# Patient Record
Sex: Female | Born: 1970 | Race: White | Hispanic: No | Marital: Married | Smoking: Never smoker
Health system: Southern US, Community
[De-identification: ages and names within clinical notes are randomized; demographics above are authoritative.]

## PROBLEM LIST (undated history)

## (undated) DIAGNOSIS — R51 Headache: Secondary | ICD-10-CM

## (undated) DIAGNOSIS — J45901 Unspecified asthma with (acute) exacerbation: Secondary | ICD-10-CM

## (undated) DIAGNOSIS — F32A Depression, unspecified: Secondary | ICD-10-CM

## (undated) DIAGNOSIS — R0902 Hypoxemia: Secondary | ICD-10-CM

## (undated) DIAGNOSIS — F329 Major depressive disorder, single episode, unspecified: Secondary | ICD-10-CM

## (undated) DIAGNOSIS — R519 Headache, unspecified: Secondary | ICD-10-CM

## (undated) HISTORY — PX: ABDOMINAL HYSTERECTOMY: SHX81

## (undated) HISTORY — DX: Unspecified asthma with (acute) exacerbation: J45.901

## (undated) HISTORY — PX: KNEE SURGERY: SHX244

## (undated) HISTORY — PX: TONSILLECTOMY: SUR1361

---

## 1996-11-14 DIAGNOSIS — O1490 Unspecified pre-eclampsia, unspecified trimester: Secondary | ICD-10-CM

## 1997-07-18 ENCOUNTER — Encounter: Admission: RE | Admit: 1997-07-18 | Discharge: 1997-09-13 | Payer: Self-pay | Admitting: Obstetrics & Gynecology

## 1997-07-26 ENCOUNTER — Inpatient Hospital Stay (HOSPITAL_COMMUNITY): Admission: AD | Admit: 1997-07-26 | Discharge: 1997-07-26 | Payer: Self-pay | Admitting: Obstetrics and Gynecology

## 1997-10-15 ENCOUNTER — Inpatient Hospital Stay (HOSPITAL_COMMUNITY): Admission: AD | Admit: 1997-10-15 | Discharge: 1997-10-15 | Payer: Self-pay | Admitting: Obstetrics and Gynecology

## 1997-11-04 ENCOUNTER — Inpatient Hospital Stay (HOSPITAL_COMMUNITY): Admission: AD | Admit: 1997-11-04 | Discharge: 1997-11-06 | Payer: Self-pay | Admitting: Obstetrics and Gynecology

## 1997-12-12 ENCOUNTER — Other Ambulatory Visit: Admission: RE | Admit: 1997-12-12 | Discharge: 1997-12-12 | Payer: Self-pay | Admitting: Obstetrics and Gynecology

## 1999-05-30 ENCOUNTER — Other Ambulatory Visit: Admission: RE | Admit: 1999-05-30 | Discharge: 1999-05-30 | Payer: Self-pay | Admitting: Obstetrics and Gynecology

## 2000-06-04 ENCOUNTER — Other Ambulatory Visit: Admission: RE | Admit: 2000-06-04 | Discharge: 2000-06-04 | Payer: Self-pay | Admitting: Obstetrics and Gynecology

## 2000-06-11 ENCOUNTER — Encounter: Payer: Self-pay | Admitting: Obstetrics and Gynecology

## 2000-06-11 ENCOUNTER — Encounter: Admission: RE | Admit: 2000-06-11 | Discharge: 2000-06-11 | Payer: Self-pay | Admitting: Obstetrics and Gynecology

## 2002-04-13 ENCOUNTER — Other Ambulatory Visit: Admission: RE | Admit: 2002-04-13 | Discharge: 2002-04-13 | Payer: Self-pay | Admitting: Obstetrics and Gynecology

## 2002-10-20 ENCOUNTER — Ambulatory Visit (HOSPITAL_COMMUNITY): Admission: RE | Admit: 2002-10-20 | Discharge: 2002-10-20 | Payer: Self-pay | Admitting: Obstetrics and Gynecology

## 2004-01-11 ENCOUNTER — Other Ambulatory Visit: Admission: RE | Admit: 2004-01-11 | Discharge: 2004-01-11 | Payer: Self-pay | Admitting: Obstetrics and Gynecology

## 2004-05-03 ENCOUNTER — Ambulatory Visit (HOSPITAL_COMMUNITY): Admission: RE | Admit: 2004-05-03 | Discharge: 2004-05-03 | Payer: Self-pay | Admitting: Obstetrics and Gynecology

## 2004-05-12 ENCOUNTER — Emergency Department (HOSPITAL_COMMUNITY): Admission: EM | Admit: 2004-05-12 | Discharge: 2004-05-12 | Payer: Self-pay | Admitting: Ophthalmology

## 2004-05-18 ENCOUNTER — Ambulatory Visit (HOSPITAL_COMMUNITY): Admission: RE | Admit: 2004-05-18 | Discharge: 2004-05-18 | Payer: Self-pay | Admitting: Obstetrics and Gynecology

## 2004-07-27 ENCOUNTER — Inpatient Hospital Stay (HOSPITAL_COMMUNITY): Admission: AD | Admit: 2004-07-27 | Discharge: 2004-07-27 | Payer: Self-pay | Admitting: Obstetrics and Gynecology

## 2004-08-20 ENCOUNTER — Encounter (INDEPENDENT_AMBULATORY_CARE_PROVIDER_SITE_OTHER): Payer: Self-pay | Admitting: Specialist

## 2004-08-21 ENCOUNTER — Inpatient Hospital Stay (HOSPITAL_COMMUNITY): Admission: AD | Admit: 2004-08-21 | Discharge: 2004-08-22 | Payer: Self-pay | Admitting: Obstetrics and Gynecology

## 2008-08-22 ENCOUNTER — Encounter: Admission: RE | Admit: 2008-08-22 | Discharge: 2008-08-22 | Payer: Self-pay | Admitting: Obstetrics and Gynecology

## 2008-11-08 ENCOUNTER — Emergency Department (HOSPITAL_COMMUNITY): Admission: EM | Admit: 2008-11-08 | Discharge: 2008-11-09 | Payer: Self-pay | Admitting: Emergency Medicine

## 2010-09-25 LAB — COMPREHENSIVE METABOLIC PANEL
ALT: 11 U/L (ref 0–35)
Alkaline Phosphatase: 67 U/L (ref 39–117)
CO2: 28 mEq/L (ref 19–32)
Chloride: 106 mEq/L (ref 96–112)
Creatinine, Ser: 0.68 mg/dL (ref 0.4–1.2)
GFR calc Af Amer: >60 mL/min (ref >60)
Potassium: 4.1 mEq/L (ref 3.5–5.1)
Total Bilirubin: 0.6 mg/dL (ref 0.3–1.2)

## 2010-09-25 LAB — CBC
Hemoglobin: 13.9 g/dL (ref 12.0–15.0)
MCHC: 33.7 g/dL (ref 30.0–36.0)
RBC: 4.53 MIL/uL (ref 3.87–5.11)
RDW: 13 % (ref 11.5–15.5)

## 2010-09-25 LAB — WET PREP, GENITAL
Clue Cells Wet Prep HPF POC: NONE SEEN
Trich, Wet Prep: NONE SEEN
Yeast Wet Prep HPF POC: NONE SEEN

## 2010-09-25 LAB — DIFFERENTIAL
Eosinophils Absolute: 0.1 10*3/uL (ref 0.0–0.7)
Lymphocytes Relative: 39 % (ref 12–46)
Lymphs Abs: 4.2 10*3/uL — ABNORMAL HIGH (ref 0.7–4.0)
Neutro Abs: 5.6 10*3/uL (ref 1.7–7.7)
Neutrophils Relative %: 53 % (ref 43–77)

## 2010-09-25 LAB — URINALYSIS, ROUTINE W REFLEX MICROSCOPIC
Hgb urine dipstick: NEGATIVE
Ketones, ur: NEGATIVE mg/dL
Nitrite: NEGATIVE
Specific Gravity, Urine: 1.024 (ref 1.005–1.030)
Urobilinogen, UA: 0.2 mg/dL (ref 0.0–1.0)
pH: 6 (ref 5.0–8.0)

## 2010-09-25 LAB — LIPASE, BLOOD: Lipase: 25 U/L (ref 11–59)

## 2010-09-25 LAB — POCT PREGNANCY, URINE: Preg Test, Ur: NEGATIVE

## 2010-11-02 NOTE — Discharge Summary (Signed)
Melissa Dodson, Melissa Dodson          ACCOUNT NO.:  [id number]   MEDICAL RECORD NO.:  [id number]          PATIENT TYPE:  INP   LOCATION:  9315                          FACILITY:  [facility]   PHYSICIAN:  Dineen Kid. Rana Snare, M.D.    DATE OF BIRTH:  Jun 07, 1971   DATE OF ADMISSION:  08/20/2004  DATE OF DISCHARGE:                                 DISCHARGE SUMMARY   HISTORY OF PRESENT ILLNESS:  Melissa Dodson is a 40 year old white female G2  P2, worsening pelvic pain over the last 6-9 months. She underwent  laparoscopy in December. There were adhesions in the left adnexa. The pain  improved for 3-4 weeks after the surgery, subsequently has gotten worse  requiring narcotics and antiinflammatories and incapacitating Victorino Dike. She  underwent a CAT scan, ultrasound, and full evaluation with no obvious source  of the pain. She desires definitive surgical intervention and plans left  salpingo-oophorectomy with hysterectomy. She declined Lupron Depot therapy  and is currently on Depo-Provera with good suppression of menses.   HOSPITAL COURSE:  The patient was admitted to same-day surgery and underwent  an LVH with LSO. The findings at the time of surgery were adhesions from the  colon and pelvic sidewall to the left tuboovarian complex. Surgery was  uncomplicated with estimated blood loss of 300 mL.  The patient's  postoperative course was complicated by postoperative pain management  issues. The patient had developed a tolerance to oxycodone before the  surgery and required a lot of narcotic-type of medication during surgery.  Postoperative pain finally was controlled by postoperative day #2 with PCA  morphine. Had a consultation with pain management from anesthesia. We began  her on regular dosing of oxycodone in the 10-20 mg q.6h. regimen with  Percocet as needed for breakthrough pain. By postoperative day #2 she was  tolerating a regular diet, ambulated without difficulty. Abdomen was soft,  nontender,  nondistended. The incisions were clean, dry, and intact. She had  normal active bowel sounds and her postoperative hemoglobin was 12.3 and the  patient was discharged home.   DISPOSITION:  The patient will follow up in the office in 2 weeks. Sent home  with a prescription for Percocet 10/325 #100. She is to take these one to  two q.6h. as needed for pain. She is also going to go home on Motrin 800 mg  q.8h. and given a prescription for Ambien 10 mg to take at bedtime as  needed. I told her to return for increased pain, fever, or bleeding.      DCL/MEDQ  D:  08/22/2004  T:  08/22/2004  Job:  [id number]

## 2010-11-02 NOTE — Op Note (Signed)
Melissa, Dodson          ACCOUNT NO.:  [id number]   MEDICAL RECORD NO.:  [id number]          PATIENT TYPE:  AMB   LOCATION:  SDC                           FACILITY:  [facility]   PHYSICIAN:  Dineen Kid. Rana Snare, M.D.    DATE OF BIRTH:  [birth year]   DATE OF PROCEDURE:  05/18/2004  DATE OF DISCHARGE:                                 OPERATIVE REPORT   PREOPERATIVE DIAGNOSES:  Left lower quadrant pain.   POSTOPERATIVE DIAGNOSES:  Left lower quadrant pain plus pelvic adhesions.   PROCEDURE:  Laparoscopy with lysis of adhesions.   SURGEON:  Dineen Kid. Rana Snare, M.D.   ANESTHESIA:  General endotracheal.   INDICATIONS FOR PROCEDURE:  [name] is a 40 year old with worsening  left lower quadrant. She has been on Depot Provera for the last number of  years with amenorrhea.  Underwent an ultrasound showing a normal GYN  ultrasound with small functional cyst not correlating with the amount of  pelvic pain and also the right ovarian cyst was noted on the right ovary and  not the left.  Her pain was persistent and worsening now requiring  antiinflammatories and Percocet without fever or chills. She underwent a CAT  Scan with and without contrast that showed normal bowel, ureters and pelvis.  The patient presented to the emergency room after that with worsening pain  that has not improved. They told her to followup with me for a laparoscopic  evaluation of the pelvis. She presents for that today. She has been missing  activities due to the pain and more or less incapacitated using pain  medication just to handle the pain and thereby not able to perform socially.  The risks and benefits were discussed at length which include but not  limited to risk of infection, bleeding, damage to bowel, bladder, ureters,  uterus, tubes, ovaries, possibly not be able to alleviate the pelvic pain or  recurrence of the pelvic pain. She does give her informed consent and wish  to proceed.   FINDINGS:  Bowel and  omental adhesions to the left broad ligament and left  pelvic sidewall, normal appearing ovaries, cul-de-sac, uterus, normal  appearing appendix and liver.   DESCRIPTION OF PROCEDURE:  After adequate analgesia, the patient placed in  the dorsal lithotomy position, she was sterilely prepped and draped, the  bladder was sterilely drained. A Graves speculum was placed and tenaculum  was placed on the anterior lip of the cervix. Once the infraumbilical skin  incision was made, a Veress needle was inserted, the abdomen was insufflated  with dullness to percussion.  A 5 mm trocar was inserted to the left of  midline under direct visualization.  The above findings were noted. Bipolar  cautery and endoshear scissors were used to coagulate and dissect the bowel  and omentum off the broad ligament freeing it up off the broad ligament and  rough fallopian tube, care taken to avoid injury to the underlying vessels,  fallopian tube and bowel. The omentum and bowel were dissected from the  pelvic sidewall and left lower abdominal wall in a similar fashion without  complications. After  good hemostasis was noted to be achieved and normal  anatomy restored, Intercede adhesion barrier was placed along the left lower  quadrant sidewall as well as placed over the broad ligament and around the  left fallopian tube with good placement noted. A small amount of saline was  placed on the Intercede __________ to ensure its placement.  The abdomen was  then desufflated, trocars removed, the infraumbilical skin incision was  closed with a suture of #0 Vicryl in the fascia and a 3-0 Vicryl Rapide  subcuticular suture. The 5 mm side was closed with a 3-0 Vicryl Rapide  subcuticular suture and a 3-0 Vicryl Rapide interrupted suture with good  approximation and good hemostasis. The incisions were then injected with  0.25% Marcaine, 10 mL total used. The tenaculum removed from the cervix and  noted to be hemostatic.  The  patient tolerated the procedure well and was  stable on transfer to the recovery room.  Sponge and instrument count was  normal x3.  Estimated blood loss during the procedure was 25 mL.  The  patient received 1 g of Rocephin preoperatively and 30 mg of Toradol  postoperatively.   DISPOSITION:  The patient will be discharged home and will followup in the  office in 2-3 weeks. She was sent home with a routine instruction sheet for  laparoscopy, sent home also with a prescription for MiraLax because of some  distention of the bowel consistent with constipation and she has a  prescription for Tylox at home. She is to call for increased pain, fever or  bleeding.      DCL/MEDQ  D:  05/18/2004  T:  05/18/2004  Job:  [id number]

## 2010-11-02 NOTE — Op Note (Signed)
NAME:  Melissa Dodson, Melissa Dodson                    ACCOUNT NO.:  [id number]   MEDICAL RECORD NO.:  [id number]                   PATIENT TYPE:  AMB   LOCATION:  SDC                                  FACILITY:  [facility]   PHYSICIAN:  Dineen Kid. Rana Snare, M.D.                 DATE OF BIRTH:  January 22, 1971   DATE OF PROCEDURE:  10/20/2002  DATE OF DISCHARGE:                                 OPERATIVE REPORT   PREOPERATIVE DIAGNOSES:  Symptomatic pelvic relaxation, decreased tone of  the introitus.   POSTOPERATIVE DIAGNOSES:  Symptomatic pelvic relaxation, decreased tone of  the introitus.   PROCEDURE:  Posterior colporrhaphy with perineorrhaphy.   SURGEON:  Dineen Kid. Rana Snare, M.D.   ANESTHESIA:  General endotracheal.   INDICATIONS:  The patient is a 41 year old G2, P2 who both her and her  husband report decreased sensation with intercourse, feel like it is related  to childbirth, and want surgical tightening of the vaginal muscles to  decrease pressure with intercourse.  They tried Kegel maneuvers.  At this  time desires surgical repair.  Denies any urinary incontinence, pressure  symptoms other than small amount of gaping in the dorsal lithotomy position.  She does have a first degree rectocele and increased tone.  She presents for  posterior colporrhaphy with perineorrhaphy.  Risks and benefits were  discussed at length.  Informed consent was obtained.  See history and  physical for further details.   DESCRIPTION OF PROCEDURE:  After adequate analgesia the patient is placed in  a dorsal lithotomy position.  She is sterilely prepped and draped.  The  bladder was sterilely drained.  The left and right portion of the hymenal  ring were grasped with Allis clamps.  A triangular flap was made across the  perineal body with a scalpel and the skin is reflected off the perineal  body.  The posterior vaginal mucosa was entered by Metzenbaum scissors and  was incised in the midline ________ laterally to  approximately 4-5 cm into  the introitus above the hymenal ring.  After reflecting laterally care was  taken to dissect the perineal and posterior vaginal tissue from the  surrounding muscles.  At this point the posterior repair was carried out  plicating the rectal fascia in the midline using figure-of-eight of 0  Monocryl achieving hemostasis with the sutures and also with Bovie cautery.  Once to the perineal body, care was taken to plicate the bulbocavernosus  muscles and superficial transverse perinei muscles with figure-of-eights of  0  Monocryl suture with good tone and strength noted of the perineal body.  Excess posterior vaginal mucosa was then excised and posterior vaginal  mucosa was closed with a 3-0 Vicryl Rapide running suture.  It was tied at  the hymenal ring.  A second suture of 0 Monocryl was used to plicate the  superficial transverse perinei muscles.  The perineal skin was then closed  with a subcuticular  stitch of 3-0 Vicryl Rapide tying at the hymenal ring  and then second supporting suture of interrupted 3-0 Vicryl Rapides were  placed across the perineal body supporting the skin.  At this point  hemostasis was achieved.  Good muscle tone was palpated.  Able to insert two  fingers at this point with a very tight fit into the introitus where as  previously three to four fingers could easily be inserted.  Rectovaginal at  this point reveals good rectal tone and also good support of the rectocele  and perineal body.  At this point the legs were replaced back into normal  anatomic position and patient was sent to the recovery room in stable  condition.  Estimated blood loss from the procedure was less than 50 mL.  Sponge and instrument count was normal x3.    DISPOSITION:  The patient will be discharged home to follow up in the office  in two to three weeks.  Told to continue with ice for the first 24 hours.  She is sent home with a prescription for Darvocet number 30.   Told to return  for increased bleeding, pain, or fever.                                               Dineen Kid Rana Snare, M.D.    DCL/MEDQ  D:  10/20/2002  T:  10/20/2002  Job:  [id number]

## 2010-11-02 NOTE — Op Note (Signed)
Melissa Dodson, Melissa Dodson          ACCOUNT NO.:  [id number]   MEDICAL RECORD NO.:  [id number]          PATIENT TYPE:  OBV   LOCATION:  9399                          FACILITY:  [facility]   PHYSICIAN:  Dineen Kid. Rana Snare, M.D.    DATE OF BIRTH:  06-30-1970   DATE OF PROCEDURE:  08/20/2004  DATE OF DISCHARGE:                                 OPERATIVE REPORT   PREOPERATIVE DIAGNOSIS:  1.  Pelvic pain.  2.  History of pelvic adhesions.   POSTOPERATIVE DIAGNOSES:  1.  Pelvic pain.  2.  History of pelvic adhesions.   PROCEDURE:  Laparoscopic-assisted vaginal hysterectomy with left salpingo-  oophorectomy and lysis of adhesions.   SURGEON:  Dineen Kid. Rana Snare, M.D.   ASSISTANT:  Stann Mainland. Vincente Poli, M.D.   ANESTHESIA:  General endotracheal.   INDICATIONS:  Melissa Dodson is a 40 year old white female, G2, P2, with  worsening pelvic pain over the last 6 months, underwent laparoscopy 3 months  ago with some adhesions in the left adnexa which were removed.  She had  improvement for 3-4 weeks that has returned and has gotten worse to the  point of requiring narcotic-type of pain medication and nonnarcotic anti-  inflammatory medications.  She has amenorrhea due to Depo-Provera.  She  underwent CAT scan, pelvic ultrasound none of which has showed identifiable  causes.  She desires definitive surgical intervention, planned hysterectomy  with left salpingo-oophorectomy.  Other alternatives such was repeat  laparoscopy, removal of left tube and ovary by itself, or Depo-Lupron were  discussed.  The patient desires hysterectomy with left salpingo-  oophorectomy.  Risks and benefits were discussed at length which include but  are not limited to risk of infection, bleeding, damage to the bowel,  bladder, ureters, right ovary, possibility that this may not alleviate the  pain, could even worsen the pain.  There is also risk associated with  anesthesia and blood transfusion.  She gives her informed consent and  wishes  to proceed.   FINDINGS AT THE TIME OF SURGERY:  Adhesions from the descending colon to the  left pelvic sidewall and the left tubo-ovarian complex.  Normal-appearing  cul-de-sac, normal-appearing right tube and ovary, normal-appearing uterus  and bladder.   DESCRIPTION OF PROCEDURE:  After adequate analgesia, the patient was placed  in the dorsal lithotomy position.  She was sterilely prepped and draped.  The bladder was sterilely drained.  A Hulka tenaculum was placed in the  cervix.  A 1 cm infraumbilical skin incision was made.  A Veress needle was  inserted.  The abdomen was insufflated to dullness on percussion.  An 11 mm  trocar was inserted.  The above findings were noted.  A 5 mm trocar was  inserted to the left of midline two fingerbreadths above the pubic  symphysis.  The Gyrus tripolar was used to coagulate and resect the omentum  and descending colon from the left pelvic sidewall and left tubo-ovarian  complex.  The mesosalpinx was dissected and left infundibulopelvic ligament  was identified and cauterized and dissected across the infundibulopelvic  down across the round ligament with the tube and ovary  falling towards the  uterine corpus.  Care was taken to avoid the underlying ureter and at the  same time achieve good hemostasis and release the bowel adhesions from the  tubo-ovarian complex and the pelvic sidewall.  The right utero-ovarian  ligament was identified, ligated, and dissected with the right tube and  ovary falling to the pelvic sidewall.  The bladder was elevated and small  windows made at the uterovesical angle and the small bladder flap was  created.  The legs were repositioned, the abdomen desufflated, weighted  speculum placed in the vagina.  A posterior colpotomy was performed.  The  Jacob's tenaculum was placed in the cervix.  The cervix was circumscribed  with Bovie cautery.  A LigaSure instrument was to ligate across the  uterosacral ligaments  and the bladder pillars bilaterally.  The bladder was  dissected off the anterior cervix.  The anterior peritoneum was entered  sharply.  A Deaver retractor was placed under the bladder.  Successive bites  with the LigaSure coagulated along the uterine vasculature up to the  inferior portion of the broad ligament bilaterally with dissection with Mayo  scissors until the uterus was removed with the left tube and ovary intact.  The uterosacral ligaments were identified, suture ligated with figure-of-  eights of 0 Monocryl suture.  The posterior peritoneum was then closed in a  pursestring fashion with 0 Monocryl suture.  The vagina was then closed in a  vertical fashion using a figure-of-eights of 0 Monocryl suture with good  approximation and good hemostasis.  The Foley catheter was placed with  return of clear, yellow urine.  The legs were repositioned.  The abdomen was  re-insufflated.  Hemostasis was achieved using bipolar cautery along the  peritoneal edges.  The right utero-ovarian ligament and the left  infundibulopelvic ligaments appeared to be hemostatic.  After copious amount  of irrigation, adequate hemostasis was assured.  Intercede adhesive barrier  was placed along the left pelvic sidewall over the infundibulopelvic  ligament and the area where the pelvic adhesions had been removed.  Good  placement noted.  The abdomen was then desufflated.  Trocars were removed.  The infraumbilical skin incision was closed with a 0 Vicryl in the fascia  interrupted suture, 3-0 Vicryl Rapide subcuticular suture.  The 5 mm site  was closed with two sutures of 3-0 Vicryl Rapide, first being a subcuticular  suture, the second being a interrupted suture.  Incision was then injected  with 0.25% Marcaine, total of 10 mL used.  The patient was stable and  transferred to recovery room.  Sponge and instrument count was normal x3. Estimated blood loss was 300 mL.  The patient received 1 g of Rocephin   preoperatively.      DCL/MEDQ  D:  08/20/2004  T:  08/20/2004  Job:  [id number]

## 2010-11-02 NOTE — H&P (Signed)
NAMEAMBERLYNN, TEMPESTA          ACCOUNT NO.:  [id number]   MEDICAL RECORD NO.:  [id number]           PATIENT TYPE:   LOCATION:                                 FACILITY:   PHYSICIAN:  Dineen Kid. Rana Snare, M.D.    DATE OF BIRTH:  [birth year]   DATE OF ADMISSION:  08/20/2004  DATE OF DISCHARGE:                                HISTORY & PHYSICAL   HISTORY OF PRESENT ILLNESS:  Melissa Dodson is a 40 year old white female G2, P2  with worsening pelvic pain over the last number of months.  She underwent a  laparoscopy at the beginning of December where there were some adhesions in  the left adnexa.  The pain improved after that for three to four weeks.  Subsequently, it has gotten worse with a predilection towards the left side.  She describes daily ache and pain which has gotten originally better on  ibuprofen, then requiring narcotic-type of pain medication.  She has been on  Depo-Provera for a number of years and has had amenorrhea for a number of  years, currently is taking Tylox and ibuprofen on a regular basis, taking 8-  10 Tylox a day for the pain.  She has had a full evaluation including  ultrasound, CT scan, IVP and laboratory evaluation which shows no known  cause of the pelvic pain, no fever or chills, nausea and vomiting, no  diarrhea or constipation.  Because of continued pelvic pain, she desires  definitive surgical intervention and requests hysterectomy with left  salpingo-oophorectomy.  Other alternatives such as repeat laparoscopy,  removing the left tube and ovary by itself or Depo-Lupron were discussed  with the patient and at this time, she presents for hysterectomy.   PAST MEDICAL HISTORY:  Roanne's past medical history is significant a  negative past medical history.  She had laparoscopy for lysis of adhesions.   PAST OBSTETRIC HISTORY:  She has had two vaginal deliveries.   MEDICATIONS:  1.  Currently on Depo-Provera.  2.  Ibuprofen.  3.  Tylox.   The patient is  allergic to sulfa.   PHYSICAL EXAMINATION:  VITAL SIGNS:  Blood pressure is 122/80.  HEART:  Regular rate and rhythm.  LUNGS:  Clear to auscultation bilaterally.  ABDOMEN:  Nondistended, nontender.  No rebound.  No guarding.  No  organomegaly.  BACK:  Without CVA tenderness.  PELVIC:  The uterus is anteverted, mobile, minimally tender to deep  palpation.  No adnexal masses are palpable.   Ultrasound on July 12, 2004 shows normal-appearing ovaries, no fluid in  the cul-de-sac.  The uterus measures 7.8 x 4.1 x 5.9 cm in size.  Normal in  appearance.  A CT scan on July 27, 2004 is a negative CT scan of the  pelvis, normal-appearing appendix and normal-appearing ovaries.   IMPRESSION AND PLAN:  Worsening pelvic pain, not improved with conservative  management which has included hormonal therapy, previous laparoscopy, the  patient currently on narcotics as well as anti-inflammatory medications with  minimal relief.  She desires definitive surgical intervention.   PLAN:  Laparoscopic-assisted vaginal hysterectomy with left salpingo-  oophorectomy.  We will  try to preserve the right ovary.  Any other indicated  procedures at the time including lysis of adhesions or ablation of  endometriosis implants.  The risks and benefits were discussed at length  with the patient and her husband which included but not limited to risk of  infection, bleeding, damage to bladder, bowel, ureters, injury to the right  ovary, the possibility that this may not alleviate the pelvic pain, could  even worsen the pelvic pain.  There is also risk associated with blood  transfusion, risk of anesthesia.  She does give her informed consent and  wishes to proceed.      DCL/MEDQ  D:  08/19/2004  T:  08/19/2004  Job:  [id number]

## 2010-11-02 NOTE — H&P (Signed)
NAME:  Melissa Dodson, Melissa Dodson                    ACCOUNT NO.:  [id number]   MEDICAL RECORD NO.:  [id number]                   PATIENT TYPE:  AMB   LOCATION:  SDC                                  FACILITY:  [facility]   PHYSICIAN:  Dineen Kid. Rana Snare, M.D.                 DATE OF BIRTH:  10-Aug-1970   DATE OF ADMISSION:  10/20/2002  DATE OF DISCHARGE:                                HISTORY & PHYSICAL   HISTORY OF PRESENT ILLNESS:  The patient is a 40 year old G2, P2 who, both  her and her husband, reports decreased sensation with intercourse.  They  feel this is related to childbirth and want surgical tightening of her  vaginal muscles. They feel like this has decreased the pleasure with  intercourse and have tried Kegel maneuvers; and, again, want surgical repair  of the perineum.  She does not have any urinary incontinence, denies any  pressure symptoms and no difficulty with bowel movements.  She has been on  Depo-Provera for a number of years and has amenorrhea with this, and  declines estrogen replacement therapy.   PAST MEDICAL HISTORY:  The patient's past medical history is negative.   PAST SURGICAL HISTORY:  The patient's past surgical history is also  negative.   PAST OBSTETRICAL HISTORY:  The patient does have two children.   MEDICATIONS:  Currently on Depo-Provera.   ALLERGIES:  The patient is allergic to SULFA.   PHYSICAL EXAMINATION:  VITAL SIGNS:  The patient is 224 pounds.  Blood  pressure is 120/82.  HEART:  Regular rate and rhythm.  LUNGS:  Clear to auscultation bilaterally.  ABDOMEN:  Nondistended and nontender.  PELVIC EXAMINATION:  Normal external genitalia, Bartholin, Skene's and  urethra. There is a small amount of gaping in the dorsal lithotomy position.  She has minimal UV angle loss with Valsalva.  She does have a first degree  cystocele and a first degree rectocele.  Minimal descensus of the uterus.  The uterus is anteverted, mobile and nontender.  No adnexal  masses are  palpable.  Rectovaginal confirms the above.  She has approximately a 1.5 cm  perineal body with good tone with a Kegel maneuver and also with good  voluntary contraction of the vaginal muscles.  Appears to have fairly good  muscular support both in the perineal body and around the bladder neck.   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   Discussed at length different treatment options including vaginal estrogen  due to the face she has been on Depo-Provera for a long period of time.  Also, I have recommended Kegel exercises as well as referral to [facility] for  physical therapy and exercise to strengthen the perineal body and  surrounding muscles.  After lengthy discussion regarding the different  treatment options the patient and her husband are convinced they want  surgical treatment.  She is willing to try  estrogen and exercises in the  meantime, but does want to proceed with surgery.  I did tell them that  perineorrhaphy is not guaranteed to alleviate the problem; and, some people  can even have more problems after that including pain with intercourse.  I  discussed the procedure, however, and the recovery time with the patient,  and the risks and benefits associated with the surgery, which include, but  are not limited risks of infection, bleeding, damage to bladder or bowel,  risks of worsening sensation or difficulty with bowel movements  or  continence after  the procedure.  I also discussed fistula formation, risks of bleeding and  also anesthesia; and, she does give her informed consent.  Again, the  patient and her husband are adamant about proceeding with some kind of  surgical repair.  We will plan a perineorrhaphy.                                                 Dineen Kid Rana Snare, M.D.    DCL/MEDQ  D:  10/19/2002  T:  10/19/2002  Job:  [id number]

## 2011-06-18 HISTORY — PX: LAPAROSCOPIC GASTRIC BANDING: SHX1100

## 2014-01-25 ENCOUNTER — Encounter (HOSPITAL_COMMUNITY): Payer: Self-pay | Admitting: Emergency Medicine

## 2014-01-25 ENCOUNTER — Emergency Department (HOSPITAL_COMMUNITY)
Admission: EM | Admit: 2014-01-25 | Discharge: 2014-01-25 | Disposition: A | Discharge status: Home / Self Care | Payer: Medicaid Other | Attending: Emergency Medicine | Admitting: Emergency Medicine

## 2014-01-25 ENCOUNTER — Emergency Department (HOSPITAL_COMMUNITY): Payer: Medicaid Other

## 2014-01-25 DIAGNOSIS — R109 Unspecified abdominal pain: Secondary | ICD-10-CM | POA: Diagnosis present

## 2014-01-25 DIAGNOSIS — Z79899 Other long term (current) drug therapy: Secondary | ICD-10-CM | POA: Insufficient documentation

## 2014-01-25 DIAGNOSIS — K8020 Calculus of gallbladder without cholecystitis without obstruction: Secondary | ICD-10-CM | POA: Diagnosis not present

## 2014-01-25 LAB — CBC WITH DIFFERENTIAL/PLATELET
BASOS PCT: 0 % (ref 0–1)
Basophils Absolute: 0 10*3/uL (ref 0.0–0.1)
EOS ABS: 0.1 10*3/uL (ref 0.0–0.7)
EOS PCT: 1 % (ref 0–5)
HEMATOCRIT: 41.3 % (ref 36.0–46.0)
Hemoglobin: 14.1 g/dL (ref 12.0–15.0)
Lymphocytes Relative: 25 % (ref 12–46)
Lymphs Abs: 2.1 10*3/uL (ref 0.7–4.0)
MCH: 29.7 pg (ref 26.0–34.0)
MCHC: 34.1 g/dL (ref 30.0–36.0)
MCV: 86.9 fL (ref 78.0–100.0)
MONOS PCT: 6 % (ref 3–12)
Monocytes Absolute: 0.5 10*3/uL (ref 0.1–1.0)
Neutro Abs: 5.7 10*3/uL (ref 1.7–7.7)
Neutrophils Relative %: 68 % (ref 43–77)
PLATELETS: 436 10*3/uL — AB (ref 150–400)
RBC: 4.75 MIL/uL (ref 3.87–5.11)
RDW: 12.6 % (ref 11.5–15.5)
WBC: 8.4 10*3/uL (ref 4.0–10.5)

## 2014-01-25 LAB — COMPREHENSIVE METABOLIC PANEL
ALBUMIN: 3.5 g/dL (ref 3.5–5.2)
ALT: 259 U/L — ABNORMAL HIGH (ref 0–35)
ANION GAP: 10 (ref 5–15)
AST: 270 U/L — ABNORMAL HIGH (ref 0–37)
Alkaline Phosphatase: 170 U/L — ABNORMAL HIGH (ref 39–117)
BILIRUBIN TOTAL: 1.2 mg/dL (ref 0.3–1.2)
BUN: 9 mg/dL (ref 6–23)
CO2: 26 meq/L (ref 19–32)
Calcium: 9.2 mg/dL (ref 8.4–10.5)
Chloride: 102 mEq/L (ref 96–112)
Creatinine, Ser: 0.59 mg/dL (ref 0.50–1.10)
GFR calc Af Amer: >90 mL/min (ref >90)
GFR calc non Af Amer: >90 mL/min (ref >90)
Glucose, Bld: 109 mg/dL — ABNORMAL HIGH (ref 70–99)
POTASSIUM: 4.1 meq/L (ref 3.7–5.3)
SODIUM: 138 meq/L (ref 137–147)
Total Protein: 6.8 g/dL (ref 6.0–8.3)

## 2014-01-25 LAB — I-STAT TROPONIN, ED: TROPONIN I, POC: 0 ng/mL (ref 0.00–0.08)

## 2014-01-25 LAB — LIPASE, BLOOD: Lipase: 80 U/L — ABNORMAL HIGH (ref 11–59)

## 2014-01-25 MED ORDER — HYDROMORPHONE HCL PF 1 MG/ML IJ SOLN
1.0000 mg | Freq: Once | INTRAMUSCULAR | Status: AC
  Administered 2014-01-25: 1 mg via INTRAVENOUS
  Filled 2014-01-25: qty 1

## 2014-01-25 MED ORDER — ONDANSETRON HCL 4 MG/2ML IJ SOLN
4.0000 mg | Freq: Once | INTRAMUSCULAR | Status: AC
  Administered 2014-01-25: 4 mg via INTRAVENOUS
  Filled 2014-01-25: qty 2

## 2014-01-25 MED ORDER — HYDROMORPHONE HCL PF 1 MG/ML IJ SOLN
0.5000 mg | Freq: Once | INTRAMUSCULAR | Status: AC
  Administered 2014-01-25: 0.5 mg via INTRAVENOUS
  Filled 2014-01-25: qty 1

## 2014-01-25 MED ORDER — SODIUM CHLORIDE 0.9 % IV BOLUS (SEPSIS)
1000.0000 mL | Freq: Once | INTRAVENOUS | Status: AC
  Administered 2014-01-25: 1000 mL via INTRAVENOUS

## 2014-01-25 MED ORDER — ONDANSETRON HCL 4 MG PO TABS: 4.0000 mg | ORAL_TABLET | Freq: Four times a day (QID) | ORAL | Status: DC

## 2014-01-25 MED ORDER — FENTANYL CITRATE 0.05 MG/ML IJ SOLN
50.0000 ug | Freq: Once | INTRAMUSCULAR | Status: AC
  Administered 2014-01-25: 50 ug via INTRAVENOUS
  Filled 2014-01-25: qty 2

## 2014-01-25 MED ORDER — OXYCODONE-ACETAMINOPHEN 5-325 MG PO TABS: 1.0000 | ORAL_TABLET | ORAL | Status: DC | PRN

## 2014-01-25 MED ORDER — GI COCKTAIL ~~LOC~~
30.0000 mL | Freq: Once | ORAL | Status: AC
  Administered 2014-01-25: 30 mL via ORAL
  Filled 2014-01-25: qty 30

## 2014-01-25 NOTE — Discharge Instructions (Signed)
Cholelithiasis °Cholelithiasis (also called gallstones) is a form of gallbladder disease in which gallstones form in your gallbladder. The gallbladder is an organ that stores bile made in the liver, which helps digest fats. Gallstones begin as small crystals and slowly grow into stones. Gallstone pain occurs when the gallbladder spasms and a gallstone is blocking the duct. Pain can also occur when a stone passes out of the duct.  °RISK FACTORS °· Being female.   °· Having multiple pregnancies. Health care providers sometimes advise removing diseased gallbladders before future pregnancies.   °· Being obese. °· Eating a diet heavy in fried foods and fat.   °· Being older than 60 years and increasing age.   °· Prolonged use of medicines containing female hormones.   °· Having diabetes mellitus.   °· Rapidly losing weight.   °· Having a family history of gallstones (heredity).   °SYMPTOMS °· Nausea.   °· Vomiting. °· Abdominal pain.   °· Yellowing of the skin (jaundice).   °· Sudden pain. It may persist from several minutes to several hours. °· Fever.   °· Tenderness to the touch.  °In some cases, when gallstones do not move into the bile duct, people have no pain or symptoms. These are called "silent" gallstones.  °TREATMENT °Silent gallstones do not need treatment. In severe cases, emergency surgery may be required. Options for treatment include: °· Surgery to remove the gallbladder. This is the most common treatment. °· Medicines. These do not always work and may take 6-12 months or more to work. °· Shock wave treatment (extracorporeal biliary lithotripsy). In this treatment an ultrasound machine sends shock waves to the gallbladder to break gallstones into smaller pieces that can pass into the intestines or be dissolved by medicine. °HOME CARE INSTRUCTIONS  °· Only take over-the-counter or prescription medicines for pain, discomfort, or fever as directed by your health care provider.   °· Follow a low-fat diet until  seen again by your health care provider. Fat causes the gallbladder to contract, which can result in pain.   °· Follow up with your health care provider as directed. Attacks are almost always recurrent and surgery is usually required for permanent treatment.   °SEEK IMMEDIATE MEDICAL CARE IF:  °· Your pain increases and is not controlled by medicines.   °· You have a fever or persistent symptoms for more than 2-3 days.   °· You have a fever and your symptoms suddenly get worse.   °· You have persistent nausea and vomiting.   °MAKE SURE YOU:  °· Understand these instructions. °· Will watch your condition. °· Will get help right away if you are not doing well or get worse. °Document Released: 05/30/2005 Document Revised: 02/03/2013 Document Reviewed: 11/25/2012 °ExitCare® Patient Information ©2015 [employer]. This information is not intended to replace advice given to you by your health care provider. Make sure you discuss any questions you have with your health care provider. ° °

## 2014-01-25 NOTE — ED Provider Notes (Signed)
Medical screening examination/treatment/procedure(s) were conducted as a shared visit with non-physician practitioner(s) and myself.  I personally evaluated the patient during the encounter.  Pt c/o upper abdominal pain, radiates to back.  Pain now improved, almost resolved. Given elev lft, u/s findings, will discuss w/consult gen surgery.     Suzi RootsKevin E Saory Carriero, MD 01/25/14 [phone]

## 2014-01-25 NOTE — ED Notes (Signed)
PA at bedside.

## 2014-01-25 NOTE — ED Provider Notes (Signed)
CSN: [id number]     Arrival date & time 01/25/14  1754 History   First MD Initiated Contact with Patient 01/25/14 1835     Chief Complaint  Patient presents with  . Abdominal Pain    Patient is a 43 y.o. female presenting with abdominal pain. The history is provided by the patient. No language interpreter was used.  Abdominal Pain Pain location:  Epigastric Pain quality: aching and sharp   Pain radiates to:  Back Pain severity:  Severe Onset quality:  Sudden Duration:  1 day Timing:  Constant Progression:  Worsening Chronicity:  New Context: not alcohol use, not awakening from sleep, not diet changes, not eating, not laxative use, not medication withdrawal, not previous surgeries, not recent illness, not recent sexual activity, not recent travel, not retching, not sick contacts, not suspicious food intake and not trauma   Relieved by:  Nothing Worsened by:  Belching Ineffective treatments:  Heat, NSAIDs and acetaminophen Associated symptoms: belching, chills and nausea   Associated symptoms: no anorexia, no chest pain, no constipation, no cough, no diarrhea, no dysuria, no fatigue, no fever, no flatus, no hematemesis, no hematochezia, no hematuria, no melena, no shortness of breath, no sore throat, no vaginal bleeding, no vaginal discharge and no vomiting   Risk factors: obesity   Risk factors: no alcohol abuse, no aspirin use, not elderly, has not had multiple surgeries, no NSAID use, not pregnant and no recent hospitalization      History reviewed. No pertinent past medical history. Past Surgical History  Procedure Laterality Date  . Knee surgery    . Abdominal hysterectomy     No family history on file. History  Substance Use Topics  . Smoking status: Never Smoker   . Smokeless tobacco: Never Used  . Alcohol Use: No   OB History   Grav Para Term Preterm Abortions TAB SAB Ect Mult Living                 Review of Systems  Constitutional: Positive for chills. Negative  for fever and fatigue.  HENT: Negative for sore throat.   Respiratory: Negative for cough and shortness of breath.   Cardiovascular: Negative for chest pain.  Gastrointestinal: Positive for nausea and abdominal pain. Negative for vomiting, diarrhea, constipation, melena, hematochezia, anorexia, flatus and hematemesis.  Genitourinary: Negative for dysuria, hematuria, vaginal bleeding and vaginal discharge.  All other systems reviewed and are negative.     Allergies  Sulfa antibiotics  Home Medications   Prior to Admission medications   Medication Sig Start Date End Date Taking? Authorizing Provider  acetaminophen (TYLENOL) 500 MG tablet Take 500-1,500 mg by mouth every 6 (six) hours as needed for moderate pain.   Yes Historical Provider, MD  ibuprofen (ADVIL,MOTRIN) 200 MG tablet Take 400 mg by mouth every 4 (four) hours as needed for moderate pain.   Yes Historical Provider, MD  sertraline (ZOLOFT) 100 MG tablet Take 100 mg by mouth daily.   Yes Historical Provider, MD  ondansetron (ZOFRAN) 4 MG tablet Take 1 tablet (4 mg total) by mouth every 6 (six) hours. 01/25/14   Roselynn Whitacre A Forcucci, PA-C  oxyCODONE-acetaminophen (PERCOCET/ROXICET) 5-325 MG per tablet Take 1-2 tablets by mouth every 4 (four) hours as needed for moderate pain or severe pain. 01/25/14   Nelson Julson A Forcucci, PA-C   BP 141/84  Pulse 57  Temp(Src) 98.6 F (37 C) (Oral)  Resp 17  Ht 5\' 11"  (1.803 m)  Wt 225 lb (102.059 kg)  BMI 31.39 kg/m2  SpO2 95% Physical Exam  Nursing note and vitals reviewed. Constitutional: She is oriented to person, place, and time. She appears well-developed and well-nourished. No distress.  HENT:  Head: Normocephalic and atraumatic.  Mouth/Throat: Oropharynx is clear and moist. No oropharyngeal exudate.  Eyes: Conjunctivae are normal. No scleral icterus.  Neck: Normal range of motion. Neck supple. No JVD present. No thyromegaly present.  Cardiovascular: Normal rate, regular rhythm,  normal heart sounds and intact distal pulses.  Exam reveals no gallop and no friction rub.   No murmur heard. Pulmonary/Chest: Effort normal and breath sounds normal. No respiratory distress. She has no wheezes. She has no rales. She exhibits no tenderness.  Abdominal: Soft. Normal appearance and bowel sounds are normal. She exhibits no distension and no mass. There is tenderness in the right upper quadrant and epigastric area. There is positive Murphy's sign. There is no rigidity, no rebound, no guarding, no CVA tenderness and no tenderness at McBurney's point.  Musculoskeletal: Normal range of motion.  Lymphadenopathy:    She has no cervical adenopathy.  Neurological: She is alert and oriented to person, place, and time. No cranial nerve deficit. Coordination normal.  Skin: Skin is warm and dry. She is not diaphoretic.  Psychiatric: She has a normal mood and affect. Her behavior is normal. Judgment and thought content normal.    ED Course  Procedures (including critical care time) Labs Review Labs Reviewed  CBC WITH DIFFERENTIAL - Abnormal; Notable for the following:    Platelets 436 (*)    All other components within normal limits  COMPREHENSIVE METABOLIC PANEL - Abnormal; Notable for the following:    Glucose, Bld 109 (*)    AST 270 (*)    ALT 259 (*)    Alkaline Phosphatase 170 (*)    All other components within normal limits  LIPASE, BLOOD - Abnormal; Notable for the following:    Lipase 80 (*)    All other components within normal limits  URINALYSIS, ROUTINE W REFLEX MICROSCOPIC  I-STAT TROPOININ, ED    Imaging Review US Abdomen Limited Ruq  01/25/2014   CLINICAL DATA:  RUQ pain  EXAM: US ABDOMEN LIMITED - RIGHT UPPER QUADRANT  COMPARISON:  Prior CT from 11/09/2008  FINDINGS: Gallbladder:  Multiple echogenic stone seen within the gallbladder lumen measuring up to 1.3 cm in diameter. Additional layering hyperdensity may represent gallbladder sludge. A positive sonographic  Murphy sign was elicited on exam. No significant gallbladder wall thickening or free pericholecystic fluid.  Common bile duct:  Diameter: 8.4 mm. No obstructive stones seen within the common bile duct.  Liver:  No focal lesion identified. Within normal limits in parenchymal echogenicity.  IMPRESSION: 1. Sludge and stones within the gallbladder lumen with positive sonographic Murphy sign, suspicious for acute cholecystitis. 2. Mild dilatation of the common bile duct up to 8.4 mm. No choledocholithiasis seen on this exam.   Electronically Signed   By: Rise Mu M.D.   On: 01/25/2014 20:47     EKG Interpretation None      MDM   Final diagnoses:  Calculus of gallbladder without cholecystitis without obstruction   Patient is a 43 y.o. Female who presents to the ED with epigastric and RUQ pain.  CBC, CMP, Lipase, troponin, RUQ ultrasound and UA were performed here in the ED.  CBC, troponin, and UA are without abnormality at this time.  CMP shows elevation in LFTs with normal total bilirubin.  Lipase is mildly elvated at 80.  RUQ shows cholelithiasis with mild diltation of the common bile duct.  Patient is afebrile here at this time.  I have spoken to Dr. Daphine DeutscherMartin who feels that the patient does not likely have cholecystitis as this time given labs and ultrasound report.  Dr. Daphine DeutscherMartin has requested that the patient be sent home with pain and nausea medication and he will see the patient in the office tomorrow morning.  Patient was able to tolerate PO here in the ED.  Patient is currently pain free with dilaudid and zofran. Patient will be discharged with percocet and zofran.  Patient was given strict return precautions of fever, intractable pain, or intractable nausea and vomiting.  Patient agrees.  I have discussed this case with Dr. Denton LankSteinl who agrees with the above plan and workup.       Eben Burowourtney A Forcucci, PA-C 01/25/14 2325

## 2014-01-25 NOTE — ED Notes (Signed)
Pt aware we need urine sample.  Unable to go at this time.  

## 2014-01-25 NOTE — ED Notes (Signed)
Pt states that she has had mid umbilical abdominal pain since last night. Pt also c/o nausea but denies vomiting or diarrhea. Also denies CP, SOB, diaphoresis, fever, or dizziness. Pt states she had similar pain about a month ago that lasted 3-4 days and then resolved. Pt sis not see a physician during the last episode.

## 2014-01-26 ENCOUNTER — Encounter (INDEPENDENT_AMBULATORY_CARE_PROVIDER_SITE_OTHER): Payer: Self-pay | Admitting: Surgery

## 2014-01-26 ENCOUNTER — Ambulatory Visit (INDEPENDENT_AMBULATORY_CARE_PROVIDER_SITE_OTHER): Payer: Medicaid Other | Admitting: Surgery

## 2014-01-26 DIAGNOSIS — K8000 Calculus of gallbladder with acute cholecystitis without obstruction: Secondary | ICD-10-CM | POA: Insufficient documentation

## 2014-01-26 DIAGNOSIS — Z9884 Bariatric surgery status: Secondary | ICD-10-CM

## 2014-01-26 HISTORY — DX: Bariatric surgery status: Z98.84

## 2014-01-26 NOTE — Progress Notes (Signed)
Chief Complaint:  Gallbladder attack last night; seen in [facility] ER  History of Present Illness:  Melissa Dodson is an 43 y.o. female who had an attack of mid abdominal pain about 3 weeks ago while at the beach. This resolved on its on. She had another attack last night and was seen in the [place] emerged room where a gallbladder ultrasound showed multiple gallstones. No gallstones were seen in her common duct although was slightly dilated. She did have elevation of her transaminases. No elevation of her bilirubin was seen.  I have given her gallbladder book and explained the procedure and complications not limited to common duct injury bleeding bile leaks or bowel injury. She wants to move ahead with the procedure. She indicated that she had a lap band placed in [place] about 2 years ago and has no information on the device. It has not been filled as far as  she knows.  No past medical history on file.  Past Surgical History  Procedure Laterality Date  . Knee surgery    . Abdominal hysterectomy    . Laparoscopic gastric banding N/A 2013    performed in [place]    Current Outpatient Prescriptions  Medication Sig Dispense Refill  . acetaminophen (TYLENOL) 500 MG tablet Take 500-1,500 mg by mouth every 6 (six) hours as needed for moderate pain.      Marland Kitchen ibuprofen (ADVIL,MOTRIN) 200 MG tablet Take 400 mg by mouth every 4 (four) hours as needed for moderate pain.      Marland Kitchen ondansetron (ZOFRAN) 4 MG tablet Take 1 tablet (4 mg total) by mouth every 6 (six) hours.  12 tablet  0  . oxyCODONE-acetaminophen (PERCOCET/ROXICET) 5-325 MG per tablet Take 1-2 tablets by mouth every 4 (four) hours as needed for moderate pain or severe pain.  10 tablet  0  . sertraline (ZOLOFT) 100 MG tablet Take 100 mg by mouth daily.       No current facility-administered medications for this visit.   Sulfa antibiotics No family history on file. Social History:   reports that she has never smoked. She has never used  smokeless tobacco. She reports that she does not drink alcohol or use illicit drugs.   REVIEW OF SYSTEMS : Negative except for headaches, nausea, chills  Physical Exam:   There were no vitals taken for this visit. There is no weight on file to calculate BMI.  Gen:  WDWN WF NAD  Neurological: Alert and oriented to person, place, and time. Motor and sensory function is grossly intact  Head: Normocephalic and atraumatic.  Eyes: Conjunctivae are normal. Pupils are equal, round, and reactive to light. No scleral icterus.  Neck: Normal range of motion. Neck supple. No tracheal deviation or thyromegaly present.  Cardiovascular:  SR without murmurs or gallops.  No carotid bruits Breast:  Not examined Respiratory: Effort normal.  No respiratory distress. No chest wall tenderness. Breath sounds normal.  No wheezes, rales or rhonchi.  Abdomen:  nontender on meds GU:  Not examined Musculoskeletal: Normal range of motion. Extremities are nontender. No cyanosis, edema or clubbing noted Lymphadenopathy: No cervical, preauricular, postauricular or axillary adenopathy is present Skin: Skin is warm and dry. No rash noted. No diaphoresis. No erythema. No pallor. Pscyh: Normal mood and affect. Behavior is normal. Judgment and thought content normal.   LABORATORY RESULTS: Results for orders placed during the hospital encounter of 01/25/14 (from the past 48 hour(s))  CBC WITH DIFFERENTIAL     Status: Abnormal  Collection Time    01/25/14  7:19 PM      Result Value Ref Range   WBC 8.4  4.0 - 10.5 K/uL   RBC 4.75  3.87 - 5.11 MIL/uL   Hemoglobin 14.1  12.0 - 15.0 g/dL   HCT 41.3  36.0 - 46.0 %   MCV 86.9  78.0 - 100.0 fL   MCH 29.7  26.0 - 34.0 pg   MCHC 34.1  30.0 - 36.0 g/dL   RDW 12.6  11.5 - 15.5 %   Platelets 436 (*) 150 - 400 K/uL   Neutrophils Relative % 68  43 - 77 %   Neutro Abs 5.7  1.7 - 7.7 K/uL   Lymphocytes Relative 25  12 - 46 %   Lymphs Abs 2.1  0.7 - 4.0 K/uL   Monocytes Relative  6  3 - 12 %   Monocytes Absolute 0.5  0.1 - 1.0 K/uL   Eosinophils Relative 1  0 - 5 %   Eosinophils Absolute 0.1  0.0 - 0.7 K/uL   Basophils Relative 0  0 - 1 %   Basophils Absolute 0.0  0.0 - 0.1 K/uL  COMPREHENSIVE METABOLIC PANEL     Status: Abnormal   Collection Time    01/25/14  7:19 PM      Result Value Ref Range   Sodium 138  137 - 147 mEq/L   Potassium 4.1  3.7 - 5.3 mEq/L   Chloride 102  96 - 112 mEq/L   CO2 26  19 - 32 mEq/L   Glucose, Bld 109 (*) 70 - 99 mg/dL   BUN 9  6 - 23 mg/dL   Creatinine, Ser 0.59  0.50 - 1.10 mg/dL   Calcium 9.2  8.4 - 10.5 mg/dL   Total Protein 6.8  6.0 - 8.3 g/dL   Albumin 3.5  3.5 - 5.2 g/dL   AST 270 (*) 0 - 37 U/L   ALT 259 (*) 0 - 35 U/L   Alkaline Phosphatase 170 (*) 39 - 117 U/L   Total Bilirubin 1.2  0.3 - 1.2 mg/dL   GFR calc non Af Amer >90  >90 mL/min   GFR calc Af Amer >90  >90 mL/min   Comment: (NOTE)     The eGFR has been calculated using the CKD EPI equation.     This calculation has not been validated in all clinical situations.     eGFR's persistently <90 mL/min signify possible Chronic Kidney     Disease.   Anion gap 10  5 - 15  LIPASE, BLOOD     Status: Abnormal   Collection Time    01/25/14  7:19 PM      Result Value Ref Range   Lipase 80 (*) 11 - 59 U/L  I-STAT TROPOININ, ED     Status: None   Collection Time    01/25/14  7:26 PM      Result Value Ref Range   Troponin i, poc 0.00  0.00 - 0.08 ng/mL   Comment 3            Comment: Due to the release kinetics of cTnI,     a negative result within the first hours     of the onset of symptoms does not rule out     myocardial infarction with certainty.     If myocardial infarction is still suspected,     repeat the test at appropriate intervals.     RADIOLOGY  RESULTS: US Abdomen Limited Ruq  01/25/2014   CLINICAL DATA:  RUQ pain  EXAM: US ABDOMEN LIMITED - RIGHT UPPER QUADRANT  COMPARISON:  Prior CT from 11/09/2008  FINDINGS: Gallbladder:  Multiple echogenic  stone seen within the gallbladder lumen measuring up to 1.3 cm in diameter. Additional layering hyperdensity may represent gallbladder sludge. A positive sonographic Murphy sign was elicited on exam. No significant gallbladder wall thickening or free pericholecystic fluid.  Common bile duct:  Diameter: 8.4 mm. No obstructive stones seen within the common bile duct.  Liver:  No focal lesion identified. Within normal limits in parenchymal echogenicity.  IMPRESSION: 1. Sludge and stones within the gallbladder lumen with positive sonographic Murphy sign, suspicious for acute cholecystitis. 2. Mild dilatation of the common bile duct up to 8.4 mm. No choledocholithiasis seen on this exam.   Electronically Signed   By: Jeannine Boga M.D.   On: 01/25/2014 20:47    Problem List: Patient Active Problem List   Diagnosis Date Noted  . Gallstones and inflammation of gallbladder without obstruction 01/26/2014  . History of laparoscopic adjustable gastric banding-done in [place] 01/26/2014    Assessment & Plan: Symptomatic gallstones-plan laparoscopic cholecystectomy with IOC    Matt B. Hassell Done, MD, [facility] [phone] beeper [phone]  01/26/2014 1:49 PM

## 2014-01-26 NOTE — Patient Instructions (Signed)

## 2014-01-27 ENCOUNTER — Encounter (HOSPITAL_COMMUNITY): Payer: Self-pay | Admitting: *Deleted

## 2014-01-27 ENCOUNTER — Encounter (HOSPITAL_COMMUNITY): Payer: Self-pay | Admitting: Pharmacy Technician

## 2014-01-27 NOTE — Anesthesia Preprocedure Evaluation (Addendum)
Anesthesia Evaluation  Patient identified by MRN, date of birth, ID band Patient awake    Reviewed: Allergy & Precautions, H&P , NPO status , Patient's Chart, lab work & pertinent test results  History of Anesthesia Complications (+) PONV and history of anesthetic complications  Airway Mallampati: II TM Distance: >3 FB Neck ROM: Full    Dental no notable dental hx.    Pulmonary neg pulmonary ROS,  breath sounds clear to auscultation  Pulmonary exam normal       Cardiovascular negative cardio ROS  Rhythm:Regular Rate:Normal     Neuro/Psych negative neurological ROS  negative psych ROS   GI/Hepatic negative GI ROS, Neg liver ROS,   Endo/Other  negative endocrine ROS  Renal/GU negative Renal ROS     Musculoskeletal negative musculoskeletal ROS (+)   Abdominal   Peds  Hematology negative hematology ROS (+)   Anesthesia Other Findings   Reproductive/Obstetrics negative OB ROS                         Anesthesia Physical Anesthesia Plan  ASA: II  Anesthesia Plan: General   Post-op Pain Management:    Induction: Intravenous  Airway Management Planned: Oral ETT  Additional Equipment:   Intra-op Plan:   Post-operative Plan: Extubation in OR  Informed Consent: I have reviewed the patients History and Physical, chart, labs and discussed the procedure including the risks, benefits and alternatives for the proposed anesthesia with the patient or authorized representative who has indicated his/her understanding and acceptance.   Dental advisory given  Plan Discussed with: CRNA  Anesthesia Plan Comments: (No hx of glaucoma, severe PONV and motion sickness. Scopolamine patch, dexamethasone and ondansetron )       Anesthesia Quick Evaluation

## 2014-01-28 ENCOUNTER — Encounter (HOSPITAL_COMMUNITY): Payer: Medicaid Other | Admitting: Anesthesiology

## 2014-01-28 ENCOUNTER — Encounter (HOSPITAL_COMMUNITY)
Admission: RE | Disposition: A | Discharge status: Home / Self Care | Payer: Self-pay | Source: Ambulatory Visit | Attending: Surgery

## 2014-01-28 ENCOUNTER — Encounter (HOSPITAL_COMMUNITY): Payer: Self-pay | Admitting: *Deleted

## 2014-01-28 ENCOUNTER — Observation Stay (HOSPITAL_COMMUNITY)
Admission: RE | Admit: 2014-01-28 | Discharge: 2014-01-29 | Disposition: A | Discharge status: Home / Self Care | Payer: Medicaid Other | Source: Ambulatory Visit | Attending: Surgery | Admitting: Surgery

## 2014-01-28 ENCOUNTER — Ambulatory Visit (HOSPITAL_COMMUNITY): Payer: Medicaid Other | Admitting: Anesthesiology

## 2014-01-28 ENCOUNTER — Ambulatory Visit (HOSPITAL_COMMUNITY): Payer: Medicaid Other

## 2014-01-28 DIAGNOSIS — Z9049 Acquired absence of other specified parts of digestive tract: Secondary | ICD-10-CM

## 2014-01-28 DIAGNOSIS — K819 Cholecystitis, unspecified: Secondary | ICD-10-CM | POA: Diagnosis present

## 2014-01-28 DIAGNOSIS — K8010 Calculus of gallbladder with chronic cholecystitis without obstruction: Secondary | ICD-10-CM | POA: Diagnosis not present

## 2014-01-28 DIAGNOSIS — K824 Cholesterolosis of gallbladder: Secondary | ICD-10-CM

## 2014-01-28 HISTORY — DX: Acquired absence of other specified parts of digestive tract: Z90.49

## 2014-01-28 HISTORY — PX: CHOLECYSTECTOMY: SHX55

## 2014-01-28 HISTORY — DX: Depression, unspecified: F32.A

## 2014-01-28 HISTORY — DX: Major depressive disorder, single episode, unspecified: F32.9

## 2014-01-28 LAB — CBC
HEMATOCRIT: 40.7 % (ref 36.0–46.0)
HEMOGLOBIN: 13.7 g/dL (ref 12.0–15.0)
MCH: 30.1 pg (ref 26.0–34.0)
MCHC: 33.7 g/dL (ref 30.0–36.0)
MCV: 89.5 fL (ref 78.0–100.0)
Platelets: 372 10*3/uL (ref 150–400)
RBC: 4.55 MIL/uL (ref 3.87–5.11)
RDW: 12.9 % (ref 11.5–15.5)
WBC: 15.7 10*3/uL — ABNORMAL HIGH (ref 4.0–10.5)

## 2014-01-28 LAB — CREATININE, SERUM
Creatinine, Ser: 0.62 mg/dL (ref 0.50–1.10)
GFR calc Af Amer: >90 mL/min (ref >90)
GFR calc non Af Amer: >90 mL/min (ref >90)

## 2014-01-28 LAB — TROPONIN I: Troponin I: <0.3 ng/mL (ref <0.30)

## 2014-01-28 LAB — CK TOTAL AND CKMB (NOT AT ARMC)
CK, MB: 1.4 ng/mL (ref 0.3–4.0)
RELATIVE INDEX: INVALID (ref 0.0–2.5)
Total CK: 67 U/L (ref 7–177)

## 2014-01-28 SURGERY — LAPAROSCOPIC CHOLECYSTECTOMY WITH INTRAOPERATIVE CHOLANGIOGRAM
Anesthesia: General

## 2014-01-28 MED ORDER — KCL IN DEXTROSE-NACL 20-5-0.45 MEQ/L-%-% IV SOLN
INTRAVENOUS | Status: DC
  Administered 2014-01-28 – 2014-01-29 (×2): via INTRAVENOUS
  Filled 2014-01-28 (×3): qty 1000

## 2014-01-28 MED ORDER — PROPOFOL 10 MG/ML IV BOLUS
INTRAVENOUS | Status: AC
  Filled 2014-01-28: qty 20

## 2014-01-28 MED ORDER — MORPHINE SULFATE 2 MG/ML IJ SOLN
1.0000 mg | INTRAMUSCULAR | Status: DC | PRN
  Administered 2014-01-28 (×2): 1 mg via INTRAVENOUS
  Filled 2014-01-28 (×2): qty 1

## 2014-01-28 MED ORDER — EPHEDRINE SULFATE 50 MG/ML IJ SOLN
INTRAMUSCULAR | Status: AC
  Filled 2014-01-28: qty 1

## 2014-01-28 MED ORDER — HYDROMORPHONE HCL PF 1 MG/ML IJ SOLN
0.5000 mg | INTRAMUSCULAR | Status: DC | PRN
  Administered 2014-01-28 – 2014-01-29 (×7): 1 mg via INTRAVENOUS
  Filled 2014-01-28 (×7): qty 1

## 2014-01-28 MED ORDER — EPHEDRINE SULFATE 50 MG/ML IJ SOLN
INTRAMUSCULAR | Status: DC | PRN
  Administered 2014-01-28 (×2): 5 mg via INTRAVENOUS

## 2014-01-28 MED ORDER — OXYCODONE-ACETAMINOPHEN 5-325 MG PO TABS
1.0000 | ORAL_TABLET | ORAL | Status: DC | PRN
  Administered 2014-01-29: 2 via ORAL
  Filled 2014-01-28 (×2): qty 2

## 2014-01-28 MED ORDER — ONDANSETRON HCL 4 MG PO TABS: 4.0000 mg | ORAL_TABLET | Freq: Four times a day (QID) | ORAL | Status: DC | PRN

## 2014-01-28 MED ORDER — DEXTROSE 5 % IV SOLN
INTRAVENOUS | Status: AC
  Filled 2014-01-28: qty 2

## 2014-01-28 MED ORDER — CHLORHEXIDINE GLUCONATE 4 % EX LIQD: 1.0000 "application " | Freq: Once | CUTANEOUS | Status: DC

## 2014-01-28 MED ORDER — LIDOCAINE HCL (CARDIAC) 20 MG/ML IV SOLN
INTRAVENOUS | Status: DC | PRN
  Administered 2014-01-28: 80 mg via INTRAVENOUS

## 2014-01-28 MED ORDER — KETOROLAC TROMETHAMINE 30 MG/ML IJ SOLN
INTRAMUSCULAR | Status: AC
  Filled 2014-01-28: qty 1

## 2014-01-28 MED ORDER — DEXTROSE 5 % IV SOLN
2.0000 g | INTRAVENOUS | Status: AC
  Administered 2014-01-28: 2 g via INTRAVENOUS

## 2014-01-28 MED ORDER — PROPOFOL 10 MG/ML IV BOLUS
INTRAVENOUS | Status: DC | PRN
  Administered 2014-01-28: 220 mg via INTRAVENOUS

## 2014-01-28 MED ORDER — FENTANYL CITRATE 0.05 MG/ML IJ SOLN
INTRAMUSCULAR | Status: AC
  Filled 2014-01-28: qty 5

## 2014-01-28 MED ORDER — HYDROMORPHONE HCL PF 1 MG/ML IJ SOLN
INTRAMUSCULAR | Status: AC
  Filled 2014-01-28: qty 1

## 2014-01-28 MED ORDER — BUPIVACAINE LIPOSOME 1.3 % IJ SUSP
20.0000 mL | Freq: Once | INTRAMUSCULAR | Status: AC
  Administered 2014-01-28: 20 mL
  Filled 2014-01-28: qty 20

## 2014-01-28 MED ORDER — SODIUM CHLORIDE 0.9 % IJ SOLN
INTRAMUSCULAR | Status: AC
  Filled 2014-01-28: qty 20

## 2014-01-28 MED ORDER — LACTATED RINGERS IV SOLN
INTRAVENOUS | Status: DC | PRN
  Administered 2014-01-28: 07:00:00 via INTRAVENOUS

## 2014-01-28 MED ORDER — LACTATED RINGERS IR SOLN
Status: DC | PRN
  Administered 2014-01-28: 1000 mL

## 2014-01-28 MED ORDER — ATROPINE SULFATE 0.4 MG/ML IJ SOLN
INTRAMUSCULAR | Status: AC
  Filled 2014-01-28: qty 2

## 2014-01-28 MED ORDER — DEXAMETHASONE SODIUM PHOSPHATE 10 MG/ML IJ SOLN
INTRAMUSCULAR | Status: DC | PRN
  Administered 2014-01-28: 10 mg via INTRAVENOUS

## 2014-01-28 MED ORDER — IOHEXOL 300 MG/ML  SOLN
INTRAMUSCULAR | Status: DC | PRN
  Administered 2014-01-28: 10 mL

## 2014-01-28 MED ORDER — HYDROMORPHONE HCL PF 1 MG/ML IJ SOLN
INTRAMUSCULAR | Status: AC
Start: 2014-01-28 — End: 2014-01-28
  Filled 2014-01-28: qty 1

## 2014-01-28 MED ORDER — ROCURONIUM BROMIDE 100 MG/10ML IV SOLN
INTRAVENOUS | Status: DC | PRN
  Administered 2014-01-28: 10 mg via INTRAVENOUS
  Administered 2014-01-28: 40 mg via INTRAVENOUS
  Administered 2014-01-28: 5 mg via INTRAVENOUS

## 2014-01-28 MED ORDER — FENTANYL CITRATE 0.05 MG/ML IJ SOLN
INTRAMUSCULAR | Status: AC
  Filled 2014-01-28: qty 2

## 2014-01-28 MED ORDER — PROMETHAZINE HCL 25 MG/ML IJ SOLN: 6.2500 mg | INTRAMUSCULAR | Status: DC | PRN

## 2014-01-28 MED ORDER — OXYCODONE HCL 5 MG PO TABS: 5.0000 mg | ORAL_TABLET | Freq: Once | ORAL | Status: DC | PRN

## 2014-01-28 MED ORDER — NEOSTIGMINE METHYLSULFATE 10 MG/10ML IV SOLN
INTRAVENOUS | Status: DC | PRN
  Administered 2014-01-28: 5 mg via INTRAVENOUS

## 2014-01-28 MED ORDER — GLYCOPYRROLATE 0.2 MG/ML IJ SOLN
INTRAMUSCULAR | Status: AC
  Filled 2014-01-28: qty 4

## 2014-01-28 MED ORDER — HEPARIN SODIUM (PORCINE) 5000 UNIT/ML IJ SOLN
5000.0000 [IU] | Freq: Three times a day (TID) | INTRAMUSCULAR | Status: DC
  Administered 2014-01-28 – 2014-01-29 (×2): 5000 [IU] via SUBCUTANEOUS
  Filled 2014-01-28 (×5): qty 1

## 2014-01-28 MED ORDER — ONDANSETRON HCL 4 MG/2ML IJ SOLN
INTRAMUSCULAR | Status: DC | PRN
  Administered 2014-01-28: 4 mg via INTRAVENOUS

## 2014-01-28 MED ORDER — GLYCOPYRROLATE 0.2 MG/ML IJ SOLN
INTRAMUSCULAR | Status: DC | PRN
  Administered 2014-01-28: .8 mg via INTRAVENOUS

## 2014-01-28 MED ORDER — ONDANSETRON HCL 4 MG/2ML IJ SOLN
INTRAMUSCULAR | Status: AC
  Filled 2014-01-28: qty 2

## 2014-01-28 MED ORDER — HYDROMORPHONE HCL PF 1 MG/ML IJ SOLN
0.2500 mg | INTRAMUSCULAR | Status: AC | PRN
  Administered 2014-01-28 (×4): 0.25 mg via INTRAVENOUS
  Administered 2014-01-28: 0.5 mg via INTRAVENOUS
  Administered 2014-01-28 (×2): 0.25 mg via INTRAVENOUS
  Administered 2014-01-28: 0.5 mg via INTRAVENOUS

## 2014-01-28 MED ORDER — SODIUM CHLORIDE 0.9 % IJ SOLN
INTRAMUSCULAR | Status: AC
  Filled 2014-01-28: qty 10

## 2014-01-28 MED ORDER — MIDAZOLAM HCL 2 MG/2ML IJ SOLN
INTRAMUSCULAR | Status: AC
  Filled 2014-01-28: qty 2

## 2014-01-28 MED ORDER — FENTANYL CITRATE 0.05 MG/ML IJ SOLN
INTRAMUSCULAR | Status: DC | PRN
  Administered 2014-01-28 (×7): 50 ug via INTRAVENOUS

## 2014-01-28 MED ORDER — SUCCINYLCHOLINE CHLORIDE 20 MG/ML IJ SOLN
INTRAMUSCULAR | Status: DC | PRN
  Administered 2014-01-28: 100 mg via INTRAVENOUS

## 2014-01-28 MED ORDER — SCOPOLAMINE 1 MG/3DAYS TD PT72
MEDICATED_PATCH | TRANSDERMAL | Status: DC | PRN
  Administered 2014-01-28: 1 via TRANSDERMAL

## 2014-01-28 MED ORDER — ROCURONIUM BROMIDE 100 MG/10ML IV SOLN
INTRAVENOUS | Status: AC
  Filled 2014-01-28: qty 1

## 2014-01-28 MED ORDER — KETOROLAC TROMETHAMINE 30 MG/ML IJ SOLN
30.0000 mg | Freq: Once | INTRAMUSCULAR | Status: AC
  Administered 2014-01-28: 30 mg via INTRAVENOUS

## 2014-01-28 MED ORDER — NEOSTIGMINE METHYLSULFATE 10 MG/10ML IV SOLN
INTRAVENOUS | Status: AC
  Filled 2014-01-28: qty 1

## 2014-01-28 MED ORDER — MEPERIDINE HCL 50 MG/ML IJ SOLN: 6.2500 mg | INTRAMUSCULAR | Status: DC | PRN

## 2014-01-28 MED ORDER — MORPHINE SULFATE 2 MG/ML IJ SOLN
2.0000 mg | INTRAMUSCULAR | Status: DC | PRN
  Administered 2014-01-28 (×3): 2 mg via INTRAVENOUS
  Filled 2014-01-28 (×3): qty 1

## 2014-01-28 MED ORDER — ONDANSETRON HCL 4 MG/2ML IJ SOLN
4.0000 mg | Freq: Four times a day (QID) | INTRAMUSCULAR | Status: DC | PRN
  Administered 2014-01-28: 4 mg via INTRAVENOUS
  Filled 2014-01-28: qty 2

## 2014-01-28 MED ORDER — HEPARIN SODIUM (PORCINE) 5000 UNIT/ML IJ SOLN
5000.0000 [IU] | Freq: Once | INTRAMUSCULAR | Status: AC
  Administered 2014-01-28: 5000 [IU] via SUBCUTANEOUS
  Filled 2014-01-28: qty 1

## 2014-01-28 MED ORDER — SCOPOLAMINE 1 MG/3DAYS TD PT72
MEDICATED_PATCH | TRANSDERMAL | Status: AC
Start: 2014-01-28 — End: 2014-01-28
  Filled 2014-01-28: qty 1

## 2014-01-28 MED ORDER — OXYCODONE HCL 5 MG/5ML PO SOLN
5.0000 mg | Freq: Once | ORAL | Status: DC | PRN
  Filled 2014-01-28: qty 5

## 2014-01-28 MED ORDER — LIDOCAINE HCL (CARDIAC) 20 MG/ML IV SOLN
INTRAVENOUS | Status: AC
  Filled 2014-01-28: qty 5

## 2014-01-28 SURGICAL SUPPLY — 44 items
ADH SKN CLS APL DERMABOND .7 (GAUZE/BANDAGES/DRESSINGS)
APL SKNCLS STERI-STRIP NONHPOA (GAUZE/BANDAGES/DRESSINGS)
APPLIER CLIP ROT 10 11.4 M/L (STAPLE) ×3
APR CLP MED LRG 11.4X10 (STAPLE) ×1
BAG SPEC RTRVL 10 TROC 200 (ENDOMECHANICALS) ×1
BAG SPEC RTRVL LRG 6X4 10 (ENDOMECHANICALS) ×1
BENZOIN TINCTURE PRP APPL 2/3 (GAUZE/BANDAGES/DRESSINGS) IMPLANT
CABLE HIGH FREQUENCY MONO STRZ (ELECTRODE) ×2 IMPLANT
CATH REDDICK CHOLANGI 4FR 50CM (CATHETERS) ×3 IMPLANT
CLIP APPLIE ROT 10 11.4 M/L (STAPLE) ×1 IMPLANT
CLOSURE WOUND 1/2 X4 (GAUZE/BANDAGES/DRESSINGS) ×1
COVER MAYO STAND STRL (DRAPES) ×3 IMPLANT
DECANTER SPIKE VIAL GLASS SM (MISCELLANEOUS) ×1 IMPLANT
DERMABOND ADVANCED (GAUZE/BANDAGES/DRESSINGS)
DERMABOND ADVANCED .7 DNX12 (GAUZE/BANDAGES/DRESSINGS) IMPLANT
DRAPE C-ARM 42X120 X-RAY (DRAPES) ×3 IMPLANT
DRAPE LAPAROSCOPIC ABDOMINAL (DRAPES) ×3 IMPLANT
ELECT REM PT RETURN 9FT ADLT (ELECTROSURGICAL) ×3
ELECTRODE REM PT RTRN 9FT ADLT (ELECTROSURGICAL) ×1 IMPLANT
GLOVE BIOGEL M 8.0 STRL (GLOVE) ×3 IMPLANT
GOWN STRL REUS W/TWL XL LVL3 (GOWN DISPOSABLE) ×6 IMPLANT
HEMOSTAT SURGICEL 4X8 (HEMOSTASIS) ×2 IMPLANT
IV CATH 14GX2 1/4 (CATHETERS) ×3 IMPLANT
KIT BASIN OR (CUSTOM PROCEDURE TRAY) ×3 IMPLANT
NDL SPNL 22GX3.5 QUINCKE BK (NEEDLE) IMPLANT
NEEDLE SPNL 22GX3.5 QUINCKE BK (NEEDLE) ×3 IMPLANT
POUCH RETRIEVAL ECOSAC 10 (ENDOMECHANICALS) ×1 IMPLANT
POUCH RETRIEVAL ECOSAC 10MM (ENDOMECHANICALS) ×2
POUCH SPECIMEN RETRIEVAL 10MM (ENDOMECHANICALS) ×2 IMPLANT
SCISSORS LAP 5X45 EPIX DISP (ENDOMECHANICALS) ×3 IMPLANT
SCRUB PCMX 4 OZ (MISCELLANEOUS) ×3 IMPLANT
SET IRRIG TUBING LAPAROSCOPIC (IRRIGATION / IRRIGATOR) ×3 IMPLANT
SLEEVE XCEL OPT CAN 5 100 (ENDOMECHANICALS) ×6 IMPLANT
SOLUTION ANTI FOG 6CC (MISCELLANEOUS) ×3 IMPLANT
STRIP CLOSURE SKIN 1/2X4 (GAUZE/BANDAGES/DRESSINGS) ×1 IMPLANT
SUT VIC AB 4-0 SH 18 (SUTURE) ×3 IMPLANT
SYRINGE 20CC LL (MISCELLANEOUS) ×3 IMPLANT
TOWEL OR 17X26 10 PK STRL BLUE (TOWEL DISPOSABLE) ×3 IMPLANT
TOWEL OR NON WOVEN STRL DISP B (DISPOSABLE) ×3 IMPLANT
TRAY LAP CHOLE (CUSTOM PROCEDURE TRAY) ×3 IMPLANT
TROCAR BLADELESS OPT 5 100 (ENDOMECHANICALS) ×3 IMPLANT
TROCAR XCEL BLUNT TIP 100MML (ENDOMECHANICALS) ×2 IMPLANT
TROCAR XCEL NON-BLD 11X100MML (ENDOMECHANICALS) ×3 IMPLANT
TUBING INSUFFLATION 10FT LAP (TUBING) ×3 IMPLANT

## 2014-01-28 NOTE — Discharge Instructions (Signed)

## 2014-01-28 NOTE — Progress Notes (Signed)
PACU nursing note. 11:00 01/28/2014         Pt was ready to transfer to 5th floor when she had a 7 beat run of v-tach. Pt had no c/o SOB or chest pain.  BP was 124/68 HR 74 and regular. Dr. Renold DonGermeroth called to bedside. EKG done and reviewed by Dr. Renold DonGermeroth. Dr. Daphine DeutscherMartin called and labs drawn. Pt medicated for surgical pain and remains stable.

## 2014-01-28 NOTE — H&P (View-Only) (Signed)
Chief Complaint:  Gallbladder attack last night; seen in [facility] ER  History of Present Illness:  Melissa Dodson is an 43 y.o. female who had an attack of mid abdominal pain about 3 [place] ago [place] the beach. This resolved on its on. She had another attack last night and was seen in the [facility] room where a gallbladder ultrasound showed multiple gallstones. No gallstones were seen in her common duct although was slightly dilated. She did have elevation of her transaminases. No elevation of her bilirubin was seen.  I have given her gallbladder book and explained the procedure and complications not limited to common duct injury bleeding bile leaks or bowel injury. She wants to move ahead with the procedure. She indicated that she had a lap band placed in [place] about 2 years ago and has no information on the device. It has not been filled as far as  she knows.  No past medical history on file.  Past Surgical History  Procedure Laterality Date  . Knee surgery    . Abdominal hysterectomy    . Laparoscopic gastric banding N/A 2013    performed in [place]    Current Outpatient Prescriptions  Medication Sig Dispense Refill  . acetaminophen (TYLENOL) 500 MG tablet Take 500-1,500 mg by mouth every 6 (six) hours as needed for moderate pain.      . ibuprofen (ADVIL,MOTRIN) 200 MG tablet Take 400 mg by mouth every 4 (four) hours as needed for moderate pain.      . ondansetron (ZOFRAN) 4 MG tablet Take 1 tablet (4 mg total) by mouth every 6 (six) hours.  12 tablet  0  . oxyCODONE-acetaminophen (PERCOCET/ROXICET) 5-325 MG per tablet Take 1-2 tablets by mouth every 4 (four) hours as needed for moderate pain or severe pain.  10 tablet  0  . sertraline (ZOLOFT) 100 MG tablet Take 100 mg by mouth daily.       No current facility-administered medications for this visit.   Sulfa antibiotics No family history on file. Social History:   reports that she has never smoked. She has never used  smokeless tobacco. She reports that she does not drink alcohol or use illicit drugs.   REVIEW OF SYSTEMS : Negative except for headaches, nausea, chills  Physical Exam:   There were no vitals taken for this visit. There is no weight on file to calculate BMI.  Gen:  WDWN WF NAD  Neurological: Alert and oriented to person, place, and time. Motor and sensory function is grossly intact  Head: Normocephalic and atraumatic.  Eyes: Conjunctivae are normal. Pupils are equal, round, and reactive to light. No scleral icterus.  Neck: Normal range of motion. Neck supple. No tracheal deviation or thyromegaly present.  Cardiovascular:  SR without murmurs or gallops.  No carotid bruits Breast:  Not examined Respiratory: Effort normal.  No respiratory distress. No chest wall tenderness. Breath sounds normal.  No wheezes, rales or rhonchi.  Abdomen:  nontender on meds GU:  Not examined Musculoskeletal: Normal range of motion. Extremities are nontender. No cyanosis, edema or clubbing noted Lymphadenopathy: No cervical, preauricular, postauricular or axillary adenopathy is present Skin: Skin is warm and dry. No rash noted. No diaphoresis. No erythema. No pallor. Pscyh: Normal mood and affect. Behavior is normal. Judgment and thought content normal.   LABORATORY RESULTS: Results for orders placed during the hospital encounter of 01/25/14 (from the past 48 hour(s))  CBC WITH DIFFERENTIAL     Status: Abnormal     Collection Time    01/25/14  7:19 PM      Result Value Ref Range   WBC 8.4  4.0 - 10.5 K/uL   RBC 4.75  3.87 - 5.11 MIL/uL   Hemoglobin 14.1  12.0 - 15.0 g/dL   HCT 41.3  36.0 - 46.0 %   MCV 86.9  78.0 - 100.0 fL   MCH 29.7  26.0 - 34.0 pg   MCHC 34.1  30.0 - 36.0 g/dL   RDW 12.6  11.5 - 15.5 %   Platelets 436 (*) 150 - 400 K/uL   Neutrophils Relative % 68  43 - 77 %   Neutro Abs 5.7  1.7 - 7.7 K/uL   Lymphocytes Relative 25  12 - 46 %   Lymphs Abs 2.1  0.7 - 4.0 K/uL   Monocytes Relative  6  3 - 12 %   Monocytes Absolute 0.5  0.1 - 1.0 K/uL   Eosinophils Relative 1  0 - 5 %   Eosinophils Absolute 0.1  0.0 - 0.7 K/uL   Basophils Relative 0  0 - 1 %   Basophils Absolute 0.0  0.0 - 0.1 K/uL  COMPREHENSIVE METABOLIC PANEL     Status: Abnormal   Collection Time    01/25/14  7:19 PM      Result Value Ref Range   Sodium 138  137 - 147 mEq/L   Potassium 4.1  3.7 - 5.3 mEq/L   Chloride 102  96 - 112 mEq/L   CO2 26  19 - 32 mEq/L   Glucose, Bld 109 (*) 70 - 99 mg/dL   BUN 9  6 - 23 mg/dL   Creatinine, Ser 0.59  0.50 - 1.10 mg/dL   Calcium 9.2  8.4 - 10.5 mg/dL   Total Protein 6.8  6.0 - 8.3 g/dL   Albumin 3.5  3.5 - 5.2 g/dL   AST 270 (*) 0 - 37 U/L   ALT 259 (*) 0 - 35 U/L   Alkaline Phosphatase 170 (*) 39 - 117 U/L   Total Bilirubin 1.2  0.3 - 1.2 mg/dL   GFR calc non Af Amer >90  >90 mL/min   GFR calc Af Amer >90  >90 mL/min   Comment: (NOTE)     The eGFR has been calculated using the CKD EPI equation.     This calculation has not been validated in all clinical situations.     eGFR's persistently <90 mL/min signify possible Chronic Kidney     Disease.   Anion gap 10  5 - 15  LIPASE, BLOOD     Status: Abnormal   Collection Time    01/25/14  7:19 PM      Result Value Ref Range   Lipase 80 (*) 11 - 59 U/L  I-STAT TROPOININ, ED     Status: None   Collection Time    01/25/14  7:26 PM      Result Value Ref Range   Troponin i, poc 0.00  0.00 - 0.08 ng/mL   Comment 3            Comment: Due to the release kinetics of cTnI,     a negative result within the first hours     of the onset of symptoms does not rule out     myocardial infarction with certainty.     If myocardial infarction is still suspected,     repeat the test at appropriate intervals.     RADIOLOGY  RESULTS: US Abdomen Limited Ruq  01/25/2014   CLINICAL DATA:  RUQ pain  EXAM: US ABDOMEN LIMITED - RIGHT UPPER QUADRANT  COMPARISON:  Prior CT from 11/09/2008  FINDINGS: Gallbladder:  Multiple echogenic  stone seen within the gallbladder lumen measuring up to 1.3 cm in diameter. Additional layering hyperdensity may represent gallbladder sludge. A positive sonographic Murphy sign was elicited on exam. No significant gallbladder wall thickening or free pericholecystic fluid.  Common bile duct:  Diameter: 8.4 mm. No obstructive stones seen within the common bile duct.  Liver:  No focal lesion identified. Within normal limits in parenchymal echogenicity.  IMPRESSION: 1. Sludge and stones within the gallbladder lumen with positive sonographic Murphy sign, suspicious for acute cholecystitis. 2. Mild dilatation of the common bile duct up to 8.4 mm. No choledocholithiasis seen on this exam.   Electronically Signed   By: Jeannine Boga M.D.   On: 01/25/2014 20:47    Problem List: Patient Active Problem List   Diagnosis Date Noted  . Gallstones and inflammation of gallbladder without obstruction 01/26/2014  . History of laparoscopic adjustable gastric banding-done in [place] 01/26/2014    Assessment & Plan: Symptomatic gallstones-plan laparoscopic cholecystectomy with IOC    Matt B. Hassell Done, MD, [facility] [phone] beeper [phone]  01/26/2014 1:49 PM

## 2014-01-28 NOTE — Transfer of Care (Signed)
Immediate Anesthesia Transfer of Care Note  Patient: Benedetto CoonsJennifer E Al-Jaouni  Procedure(s) Performed: Procedure(s): LAPAROSCOPIC CHOLECYSTECTOMY WITH INTRAOPERATIVE CHOLANGIOGRAM (N/A)  Patient Location: PACU  Anesthesia Type:General  Level of Consciousness: awake, alert , oriented and patient cooperative  Airway & Oxygen Therapy: Patient Spontanous Breathing and Patient connected to face mask oxygen  Post-op Assessment: Report given to PACU RN, Post -op Vital signs reviewed and stable and Patient moving all extremities  Post vital signs: Reviewed and stable  Complications: No apparent anesthesia complications

## 2014-01-28 NOTE — Interval H&P Note (Signed)
History and Physical Interval Note:  01/28/2014 7:17 AM  [name]  has presented today for surgery, with the diagnosis of Cholecystitis  The various methods of treatment have been discussed with the patient and family. After consideration of risks, benefits and other options for treatment, the patient has consented to  Procedure(s): LAPAROSCOPIC CHOLECYSTECTOMY WITH INTRAOPERATIVE CHOLANGIOGRAM (N/A) as a surgical intervention .  The patient's history has been reviewed, patient examined, no change in status, stable for surgery.  I have reviewed the patient's chart and labs.  Questions were answered to the patient's satisfaction.     Ivie Maese B

## 2014-01-28 NOTE — Op Note (Signed)
Melissa RollsJennifer E Dodson @date @  Procedure: Laparoscopic Cholecystectomy with intraoperative cholangiogram  Surgeon: Wenda LowMatt Lindyn Vossler, MD, FACS Asst:  none  Anes:  General  Drains: None  Findings: Contracted gallbladder with chronic adhesions to the fundus and dense fibrotic adhesions in the infundibulum region; cholangiongram revealed a long and tortuous cystic duct with intrahepatic filling and free flow into the duodenum.; patient has a gastric band in place with the port in the upper midabdomen trocar site and it may be a [place] band.    Description of Procedure: The patient was taken to OR 6 on Friday, January 28, 2014 and given general anesthesia.  The patient was prepped with PCMX and draped sterilely. A time out was performed.  Access to the abdomen was achieved with a 5 mm Optiview through the right upper quadrant.  Port placement included three 5 mm trocars and an 11 mm in the upper midline.    The gallbladder was visualized and the fundus was grasped and the gallbladder was elevated. Traction on the infundibulum allowed for successful demonstration of the critical view. Inflammatory changes were chronic and dense.  The cystic duct was identified and clipped up on the gallbladder and an incision was made in the cystic duct and the Reddick catheter was inserted after milking the cystic duct of any debris. A dynamic cholangiogram was performed which demonstrated the above findings .    The cystic duct was then triple clipped and divided, the cystic artery was double clipped and divided and then the gallbladder was removed from the gallbladder bed. Removal of the gallbladder from the gallbladder bed was performed without incidence with the hook electrocautery.  The gallbladder was then placed in a bag and brought out through the 11 mm trocar site. The gallbladder bed was inspected and no bleeding or bile leaks were seen.    Incisions were injected with Exparel and closed with 4-0 Vicryl and Dermabond  on the skin.  Sponge and needle count were correct.    The patient was taken to the recovery room in satisfactory condition.   Matt B. Daphine DeutscherMartin, MD, [facility]  [facility] [phone] beeper [phone]  01/28/2014 9:28 AM

## 2014-01-28 NOTE — Anesthesia Postprocedure Evaluation (Signed)
Anesthesia Post Note  Patient: Melissa CoonsJennifer E Al-Jaouni  Procedure(s) Performed: Procedure(s) (LRB): LAPAROSCOPIC CHOLECYSTECTOMY WITH INTRAOPERATIVE CHOLANGIOGRAM (N/A)  Anesthesia type: General  Patient location: PACU  Post pain: Pain level controlled  Post assessment: Post-op Vital signs reviewed  Last Vitals: BP 134/68  Pulse 73  Temp(Src) 37 C (Oral)  Resp 14  Ht 5\' 11"  (1.803 m)  Wt 258 lb 2.5 oz (117.1 kg)  BMI 36.02 kg/m2  SpO2 90%  Post vital signs: Reviewed  Level of consciousness: sedated  PACU course: Stable throughout PACU stay. Had 7 beats of V-tach on monitor. EKG and cardiac enzymes sent. Discussed with Dr. Daphine DeutscherMartin and agreed with telemetry monitoring postop and possible cardiology consult if V-tach episode occurs again.  Complications: No apparent anesthesia complications

## 2014-01-29 DIAGNOSIS — K8010 Calculus of gallbladder with chronic cholecystitis without obstruction: Secondary | ICD-10-CM | POA: Diagnosis not present

## 2014-01-29 LAB — COMPREHENSIVE METABOLIC PANEL
ALK PHOS: 128 U/L — AB (ref 39–117)
ALT: 91 U/L — ABNORMAL HIGH (ref 0–35)
ANION GAP: 10 (ref 5–15)
AST: 27 U/L (ref 0–37)
Albumin: 3 g/dL — ABNORMAL LOW (ref 3.5–5.2)
BILIRUBIN TOTAL: 0.4 mg/dL (ref 0.3–1.2)
BUN: 6 mg/dL (ref 6–23)
CHLORIDE: 102 meq/L (ref 96–112)
CO2: 26 mEq/L (ref 19–32)
Calcium: 8.7 mg/dL (ref 8.4–10.5)
Creatinine, Ser: 0.56 mg/dL (ref 0.50–1.10)
GLUCOSE: 130 mg/dL — AB (ref 70–99)
POTASSIUM: 4.3 meq/L (ref 3.7–5.3)
Sodium: 138 mEq/L (ref 137–147)
Total Protein: 5.9 g/dL — ABNORMAL LOW (ref 6.0–8.3)

## 2014-01-29 LAB — CBC
HCT: 37.5 % (ref 36.0–46.0)
Hemoglobin: 12.5 g/dL (ref 12.0–15.0)
MCH: 29.6 pg (ref 26.0–34.0)
MCHC: 33.3 g/dL (ref 30.0–36.0)
MCV: 88.7 fL (ref 78.0–100.0)
PLATELETS: 389 10*3/uL (ref 150–400)
RBC: 4.23 MIL/uL (ref 3.87–5.11)
RDW: 13 % (ref 11.5–15.5)
WBC: 16.4 10*3/uL — ABNORMAL HIGH (ref 4.0–10.5)

## 2014-01-29 MED ORDER — OXYCODONE-ACETAMINOPHEN 5-325 MG PO TABS: 1.0000 | ORAL_TABLET | ORAL | Status: DC | PRN

## 2014-01-29 NOTE — Discharge Summary (Signed)
Physician Discharge Summary  Patient ID: Benedetto CoonsJennifer E Al-Jaouni MRN: [id number] DOB/AGE: 1970-10-07 43 y.o.  Admit date: 01/28/2014 Discharge date: 01/29/2014  Admission Diagnoses:  cholecystitis  Discharge Diagnoses:  same  Active Problems:   Status post laparoscopic cholecystectomy August 2015   Surgery:  Lap chole IOC  Discharged Condition: improved  Hospital Course:   Had surgery.  Five beats of v tach noted in PACU.  No prior history of cardiac problems.  Sent to be monitored.  No problems on monitor overnight.  Ready for discharge.    Consults: none  Significant Diagnostic Studies: cardiac enzymes normal    Discharge Exam: Blood pressure 123/56, pulse 65, temperature 98.5 F (36.9 C), temperature source Oral, resp. rate 16, height 5\' 11"  (1.803 m), weight 258 lb 2.5 oz (117.1 kg), SpO2 96.00%. Eating well. Minimal abdominal pain  Disposition: 01-Home or Self Care  Discharge Instructions   Call MD for:  persistant nausea and vomiting    Complete by:  As directed      Call MD for:  severe uncontrolled pain    Complete by:  As directed      Diet - low sodium heart healthy    Complete by:  As directed      Discharge instructions    Complete by:  As directed   Advance diet gradually     Discharge wound care:    Complete by:  As directed   May shower ad lib     Increase activity slowly    Complete by:  As directed             Medication List         acetaminophen 500 MG tablet  Commonly known as:  TYLENOL  Take 500-1,500 mg by mouth every 6 (six) hours as needed for moderate pain.     ibuprofen 200 MG tablet  Commonly known as:  ADVIL,MOTRIN  Take 400 mg by mouth every 4 (four) hours as needed for moderate pain.     ondansetron 4 MG tablet  Commonly known as:  ZOFRAN  Take 1 tablet (4 mg total) by mouth every 6 (six) hours.     oxyCODONE-acetaminophen 5-325 MG per tablet  Commonly known as:  PERCOCET/ROXICET  Take 1-2 tablets by mouth every 4 (four)  hours as needed for moderate pain or severe pain.     oxyCODONE-acetaminophen 5-325 MG per tablet  Commonly known as:  PERCOCET/ROXICET  Take 1-2 tablets by mouth every 4 (four) hours as needed for moderate pain.     sertraline 100 MG tablet  Commonly known as:  ZOLOFT  Take 100 mg by mouth at bedtime.           Follow-up Information   Follow up with Luretha MurphyMARTIN,Zarea Diesing B, MD. Schedule an appointment as soon as possible for a visit in 3 weeks.   Specialty:  General Surgery   Contact information:   [address] [place] [address] [phone]       Signed: Valarie MerinoMARTIN,Carylon Tamburro B 01/29/2014, 10:11 AM

## 2014-01-31 ENCOUNTER — Encounter (HOSPITAL_COMMUNITY): Payer: Self-pay | Admitting: Surgery

## 2014-02-10 ENCOUNTER — Ambulatory Visit (INDEPENDENT_AMBULATORY_CARE_PROVIDER_SITE_OTHER): Payer: Medicaid Other | Admitting: General Surgery

## 2014-03-18 ENCOUNTER — Ambulatory Visit
Admission: RE | Admit: 2014-03-18 | Discharge: 2014-03-18 | Disposition: A | Discharge status: Home / Self Care | Payer: Medicaid Other | Source: Ambulatory Visit | Attending: Family Medicine | Admitting: Family Medicine

## 2014-03-18 ENCOUNTER — Other Ambulatory Visit: Payer: Self-pay | Admitting: Family Medicine

## 2014-03-18 DIAGNOSIS — M25511 Pain in right shoulder: Secondary | ICD-10-CM

## 2014-04-27 ENCOUNTER — Ambulatory Visit: Payer: Medicaid Other | Admitting: Physical Therapy

## 2014-07-07 ENCOUNTER — Encounter (HOSPITAL_COMMUNITY): Payer: Self-pay | Admitting: *Deleted

## 2014-07-07 ENCOUNTER — Emergency Department (HOSPITAL_COMMUNITY): Payer: Medicaid Other

## 2014-07-07 ENCOUNTER — Emergency Department (HOSPITAL_COMMUNITY)
Admission: EM | Admit: 2014-07-07 | Discharge: 2014-07-08 | Disposition: A | Discharge status: Home / Self Care | Payer: Medicaid Other | Attending: Emergency Medicine | Admitting: Emergency Medicine

## 2014-07-07 DIAGNOSIS — R109 Unspecified abdominal pain: Secondary | ICD-10-CM | POA: Diagnosis present

## 2014-07-07 DIAGNOSIS — Z9049 Acquired absence of other specified parts of digestive tract: Secondary | ICD-10-CM | POA: Insufficient documentation

## 2014-07-07 DIAGNOSIS — R197 Diarrhea, unspecified: Secondary | ICD-10-CM | POA: Diagnosis not present

## 2014-07-07 DIAGNOSIS — F329 Major depressive disorder, single episode, unspecified: Secondary | ICD-10-CM | POA: Diagnosis not present

## 2014-07-07 DIAGNOSIS — R110 Nausea: Secondary | ICD-10-CM | POA: Insufficient documentation

## 2014-07-07 LAB — CBC WITH DIFFERENTIAL/PLATELET
BASOS PCT: 0 % (ref 0–1)
Basophils Absolute: 0 10*3/uL (ref 0.0–0.1)
EOS ABS: 0 10*3/uL (ref 0.0–0.7)
Eosinophils Relative: 0 % (ref 0–5)
HEMATOCRIT: 44 % (ref 36.0–46.0)
Hemoglobin: 14.9 g/dL (ref 12.0–15.0)
LYMPHS ABS: 2.8 10*3/uL (ref 0.7–4.0)
LYMPHS PCT: 26 % (ref 12–46)
MCH: 30.2 pg (ref 26.0–34.0)
MCHC: 33.9 g/dL (ref 30.0–36.0)
MCV: 89.1 fL (ref 78.0–100.0)
Monocytes Absolute: 0.5 10*3/uL (ref 0.1–1.0)
Monocytes Relative: 5 % (ref 3–12)
Neutro Abs: 7.4 10*3/uL (ref 1.7–7.7)
Neutrophils Relative %: 69 % (ref 43–77)
PLATELETS: 304 10*3/uL (ref 150–400)
RBC: 4.94 MIL/uL (ref 3.87–5.11)
RDW: 12.8 % (ref 11.5–15.5)
WBC: 10.8 10*3/uL — ABNORMAL HIGH (ref 4.0–10.5)

## 2014-07-07 LAB — COMPREHENSIVE METABOLIC PANEL
ALT: 15 U/L (ref 0–35)
AST: 17 U/L (ref 0–37)
Albumin: 4 g/dL (ref 3.5–5.2)
Alkaline Phosphatase: 63 U/L (ref 39–117)
Anion gap: 12 (ref 5–15)
BILIRUBIN TOTAL: 0.9 mg/dL (ref 0.3–1.2)
BUN: 11 mg/dL (ref 6–23)
CO2: 26 mmol/L (ref 19–32)
CREATININE: 0.64 mg/dL (ref 0.50–1.10)
Calcium: 9.3 mg/dL (ref 8.4–10.5)
Chloride: 101 mEq/L (ref 96–112)
GFR calc Af Amer: >90 mL/min (ref >90)
GFR calc non Af Amer: >90 mL/min (ref >90)
GLUCOSE: 108 mg/dL — AB (ref 70–99)
POTASSIUM: 3.3 mmol/L — AB (ref 3.5–5.1)
Sodium: 139 mmol/L (ref 135–145)
Total Protein: 6.8 g/dL (ref 6.0–8.3)

## 2014-07-07 LAB — URINALYSIS, ROUTINE W REFLEX MICROSCOPIC
Bilirubin Urine: NEGATIVE
Glucose, UA: NEGATIVE mg/dL
Hgb urine dipstick: NEGATIVE
Leukocytes, UA: NEGATIVE
Nitrite: NEGATIVE
PROTEIN: NEGATIVE mg/dL
SPECIFIC GRAVITY, URINE: 1.036 — AB (ref 1.005–1.030)
Urobilinogen, UA: 0.2 mg/dL (ref 0.0–1.0)
pH: 6 (ref 5.0–8.0)

## 2014-07-07 LAB — LIPASE, BLOOD: Lipase: 30 U/L (ref 11–59)

## 2014-07-07 MED ORDER — SODIUM CHLORIDE 0.9 % IV BOLUS (SEPSIS)
1000.0000 mL | Freq: Once | INTRAVENOUS | Status: AC
  Administered 2014-07-07: 1000 mL via INTRAVENOUS

## 2014-07-07 MED ORDER — IOHEXOL 300 MG/ML  SOLN
25.0000 mL | INTRAMUSCULAR | Status: AC
  Administered 2014-07-07: 25 mL via ORAL

## 2014-07-07 MED ORDER — HYDROMORPHONE HCL 1 MG/ML IJ SOLN
1.0000 mg | Freq: Once | INTRAMUSCULAR | Status: AC
  Administered 2014-07-07: 1 mg via INTRAVENOUS
  Filled 2014-07-07: qty 1

## 2014-07-07 MED ORDER — MORPHINE SULFATE 4 MG/ML IJ SOLN
4.0000 mg | Freq: Once | INTRAMUSCULAR | Status: AC
  Administered 2014-07-07: 4 mg via INTRAVENOUS
  Filled 2014-07-07: qty 1

## 2014-07-07 MED ORDER — ONDANSETRON HCL 4 MG/2ML IJ SOLN
4.0000 mg | Freq: Once | INTRAMUSCULAR | Status: AC
  Administered 2014-07-07: 4 mg via INTRAVENOUS
  Filled 2014-07-07: qty 2

## 2014-07-07 MED ORDER — IOHEXOL 300 MG/ML  SOLN
100.0000 mL | Freq: Once | INTRAMUSCULAR | Status: AC | PRN
  Administered 2014-07-07: 100 mL via INTRAVENOUS

## 2014-07-07 NOTE — ED Provider Notes (Signed)
TIME SEEN: 9:05 PM  CHIEF COMPLAINT: Abdominal pain, nausea  HPI: Pt is a 44 y.o. female with history of depression who is status post cholecystectomy, hysterectomy who presents emergency department with right-sided abdominal pain for the past 3 days, diarrhea, nausea. No sick contacts or recent travel. No dysuria, hematuria, vaginal bleeding or discharge. No vomiting. No fevers or chills.  ROS: See HPI Constitutional: no fever  Eyes: no drainage  ENT: no runny nose   Cardiovascular:  no chest pain  Resp: no SOB  GI: no vomiting GU: no dysuria Integumentary: no rash  Allergy: no hives  Musculoskeletal: no leg swelling  Neurological: no slurred speech ROS otherwise negative  PAST MEDICAL HISTORY/PAST SURGICAL HISTORY:  Past Medical History  Diagnosis Date  . Depression     MEDICATIONS:  Prior to Admission medications   Medication Sig Start Date End Date Taking? Authorizing Provider  acetaminophen (TYLENOL) 500 MG tablet Take 500-1,500 mg by mouth every 6 (six) hours as needed for moderate pain.   Yes Historical Provider, MD  ibuprofen (ADVIL,MOTRIN) 200 MG tablet Take 400 mg by mouth every 4 (four) hours as needed for moderate pain.   Yes Historical Provider, MD  sertraline (ZOLOFT) 100 MG tablet Take 100 mg by mouth at bedtime.    Yes Historical Provider, MD  ondansetron (ZOFRAN) 4 MG tablet Take 1 tablet (4 mg total) by mouth every 6 (six) hours. Patient not taking: Reported on 07/07/2014 01/25/14   Courtney A Forcucci, PA-C  oxyCODONE-acetaminophen (PERCOCET/ROXICET) 5-325 MG per tablet Take 1-2 tablets by mouth every 4 (four) hours as needed for moderate pain or severe pain. Patient not taking: Reported on 07/07/2014 01/25/14   Courtney A Forcucci, PA-C  oxyCODONE-acetaminophen (PERCOCET/ROXICET) 5-325 MG per tablet Take 1-2 tablets by mouth every 4 (four) hours as needed for moderate pain. Patient not taking: Reported on 07/07/2014 01/29/14   Valarie Merino, MD    ALLERGIES:   Allergies  Allergen Reactions  . Sulfa Antibiotics Hives    SOCIAL HISTORY:  History  Substance Use Topics  . Smoking status: Never Smoker   . Smokeless tobacco: Never Used  . Alcohol Use: No    FAMILY HISTORY: History reviewed. No pertinent family history.  EXAM: BP 125/64 mmHg  Pulse 74  Temp(Src) 98 F (36.7 C) (Oral)  Resp 16  Ht  (1.778 m)  Wt 250 lb (113.399 kg)  BMI 35.87 kg/m2  SpO2 97% CONSTITUTIONAL: Alert and oriented and responds appropriately to questions. Well-appearing; well-nourished HEAD: Normocephalic EYES: Conjunctivae clear, PERRL ENT: normal nose; no rhinorrhea; moist mucous membranes; pharynx without lesions noted NECK: Supple, no meningismus, no LAD  CARD: RRR; S1 and S2 appreciated; no murmurs, no clicks, no rubs, no gallops RESP: Normal chest excursion without splinting or tachypnea; breath sounds clear and equal bilaterally; no wheezes, no rhonchi, no rales,  ABD/GI: Normal bowel sounds; non-distended; soft, mildly tender palpation in the right upper quadrant and mid abdomen that has increasing tenderness in the right lower abdomen without guarding or rebound, no peritoneal signs BACK:  The back appears normal and is non-tender to palpation, there is no CVA tenderness EXT: Normal ROM in all joints; non-tender to palpation; no edema; normal capillary refill; no cyanosis    SKIN: Normal color for age and race; warm NEURO: Moves all extremities equally PSYCH: The patient's mood and manner are appropriate. Grooming and personal hygiene are appropriate.  MEDICAL DECISION MAKING: Patient here with concerns for appendicitis. Differential also includes pancreatitis, hepatitis,  small bowel obstruction, coli this. She has a mild leukocytosis with left shift. She is afebrile. We'll obtain CT of her abdomen and pelvis.  ED PROGRESS: Patient's CT scan shows no acute abnormality. She has a normal appendix. When patient is asked where she is having pain  she states there is a pinpoint area of pain in her right upper quadrant to mid right upper abdomen. She denies that she has pain in her right lower quadrant until this area is palpated but is not having significant pain currently. I do not think that she has any pelvic pathology causing her pain and she has no pelvic pain on exam. No vaginal bleeding or discharge. No urinary symptoms. She reports feeling much better. We'll discharge with pain medicine and nausea medicine. Have advised her to follow-up with her primary care physician. Discussed return precautions. She verbalized understanding and is comfortable with plan.     Melissa MawKristen N Sayge Salvato, DO 07/08/14 [phone]

## 2014-07-07 NOTE — ED Notes (Signed)
Pt had a cholecystectomy approx 3 months ago.  Pt sts this pain is completely different from her previous pain with her gallbladder.  Pt sts diarrhea, dizziness, nausea, and pain all began at same time.  Sts pain has been getting progressively worse over the past three days.

## 2014-07-07 NOTE — ED Notes (Signed)
Pt c/o right middle abdominal pain for three days. Pain is tender to palpation. Pt denies dysuria, hematuria, vaginal pain, vaginal discharge, constipation. Reports some mild diarrhea. Denies fevers at home. Reports some intermittent mild nausea, denies vomiting.

## 2014-07-07 NOTE — ED Notes (Signed)
CT notified pt is done with contrast.  Pt sts pain is increasing in severity at "an alarming rate".  CT notified of possible opportunity to bring pt sooner, CT will come to get her ASAP.

## 2014-07-08 MED ORDER — HYDROCODONE-ACETAMINOPHEN 5-325 MG PO TABS
1.0000 | ORAL_TABLET | ORAL | Status: DC | PRN
Start: 2014-07-08 — End: 2014-09-01

## 2014-07-08 MED ORDER — ONDANSETRON 4 MG PO TBDP: 4.0000 mg | ORAL_TABLET | Freq: Three times a day (TID) | ORAL | Status: DC | PRN

## 2014-07-08 NOTE — Discharge Instructions (Signed)
Abdominal Pain, Women °Abdominal (stomach, pelvic, or belly) pain can be caused by many things. It is important to tell your doctor: °· The location of the pain. °· Does it come and go or is it present all the time? °· Are there things that start the pain (eating certain foods, exercise)? °· Are there other symptoms associated with the pain (fever, nausea, vomiting, diarrhea)? °All of this is helpful to know when trying to find the cause of the pain. °CAUSES  °· Stomach: virus or bacteria infection, or ulcer. °· Intestine: appendicitis (inflamed appendix), regional ileitis (Crohn's disease), ulcerative colitis (inflamed colon), irritable bowel syndrome, diverticulitis (inflamed diverticulum of the colon), or cancer of the stomach or intestine. °· Gallbladder disease or stones in the gallbladder. °· Kidney disease, kidney stones, or infection. °· Pancreas infection or cancer. °· Fibromyalgia (pain disorder). °· Diseases of the female organs: °¨ Uterus: fibroid (non-cancerous) tumors or infection. °¨ Fallopian tubes: infection or tubal pregnancy. °¨ Ovary: cysts or tumors. °¨ Pelvic adhesions (scar tissue). °¨ Endometriosis (uterus lining tissue growing in the pelvis and on the pelvic organs). °¨ Pelvic congestion syndrome (female organs filling up with blood just before the menstrual period). °¨ Pain with the menstrual period. °¨ Pain with ovulation (producing an egg). °¨ Pain with an IUD (intrauterine device, birth control) in the uterus. °¨ Cancer of the female organs. °· Functional pain (pain not caused by a disease, may improve without treatment). °· Psychological pain. °· Depression. °DIAGNOSIS  °Your doctor will decide the seriousness of your pain by doing an examination. °· Blood tests. °· X-rays. °· Ultrasound. °· CT scan (computed tomography, special type of X-ray). °· MRI (magnetic resonance imaging). °· Cultures, for infection. °· Barium enema (dye inserted in the large intestine, to better view it with  X-rays). °· Colonoscopy (looking in intestine with a lighted tube). °· Laparoscopy (minor surgery, looking in abdomen with a lighted tube). °· Major abdominal exploratory surgery (looking in abdomen with a large incision). °TREATMENT  °The treatment will depend on the cause of the pain.  °· Many cases can be observed and treated at home. °· Over-the-counter medicines recommended by your caregiver. °· Prescription medicine. °· Antibiotics, for infection. °· Birth control pills, for painful periods or for ovulation pain. °· Hormone treatment, for endometriosis. °· Nerve blocking injections. °· Physical therapy. °· Antidepressants. °· Counseling with a psychologist or psychiatrist. °· Minor or major surgery. °HOME CARE INSTRUCTIONS  °· Do not take laxatives, unless directed by your caregiver. °· Take over-the-counter pain medicine only if ordered by your caregiver. Do not take aspirin because it can cause an upset stomach or bleeding. °· Try a clear liquid diet (broth or water) as ordered by your caregiver. Slowly move to a bland diet, as tolerated, if the pain is related to the stomach or intestine. °· Have a thermometer and take your temperature several times a day, and record it. °· Bed rest and sleep, if it helps the pain. °· Avoid sexual intercourse, if it causes pain. °· Avoid stressful situations. °· Keep your follow-up appointments and tests, as your caregiver orders. °· If the pain does not go away with medicine or surgery, you may try: °¨ Acupuncture. °¨ Relaxation exercises (yoga, meditation). °¨ Group therapy. °¨ Counseling. °SEEK MEDICAL CARE IF:  °· You notice certain foods cause stomach pain. °· Your home care treatment is not helping your pain. °· You need stronger pain medicine. °· You want your IUD removed. °· You feel faint or   lightheaded. °· You develop nausea and vomiting. °· You develop a rash. °· You are having side effects or an allergy to your medicine. °SEEK IMMEDIATE MEDICAL CARE IF:  °· Your  pain does not go away or gets worse. °· You have a fever. °· Your pain is felt only in portions of the abdomen. The right side could possibly be appendicitis. The left lower portion of the abdomen could be colitis or diverticulitis. °· You are passing blood in your stools (bright red or black tarry stools, with or without vomiting). °· You have blood in your urine. °· You develop chills, with or without a fever. °· You pass out. °MAKE SURE YOU:  °· Understand these instructions. °· Will watch your condition. °· Will get help right away if you are not doing well or get worse. °Document Released: 03/31/2007 Document Revised: 10/18/2013 Document Reviewed: 04/20/2009 °ExitCare® Patient Information ©2015 [employer]. This information is not intended to replace advice given to you by your health care provider. Make sure you discuss any questions you have with your health care provider. ° °

## 2014-08-30 ENCOUNTER — Emergency Department (HOSPITAL_COMMUNITY): Payer: Medicaid Other

## 2014-08-30 ENCOUNTER — Emergency Department (HOSPITAL_COMMUNITY)
Admission: EM | Admit: 2014-08-30 | Discharge: 2014-08-30 | Disposition: A | Discharge status: Home / Self Care | Payer: Medicaid Other | Attending: Emergency Medicine | Admitting: Emergency Medicine

## 2014-08-30 ENCOUNTER — Encounter (HOSPITAL_COMMUNITY): Payer: Self-pay | Admitting: Emergency Medicine

## 2014-08-30 DIAGNOSIS — Z9049 Acquired absence of other specified parts of digestive tract: Secondary | ICD-10-CM | POA: Insufficient documentation

## 2014-08-30 DIAGNOSIS — R112 Nausea with vomiting, unspecified: Secondary | ICD-10-CM

## 2014-08-30 DIAGNOSIS — Z90710 Acquired absence of both cervix and uterus: Secondary | ICD-10-CM | POA: Insufficient documentation

## 2014-08-30 DIAGNOSIS — N8320 Unspecified ovarian cysts: Secondary | ICD-10-CM | POA: Diagnosis not present

## 2014-08-30 DIAGNOSIS — F329 Major depressive disorder, single episode, unspecified: Secondary | ICD-10-CM | POA: Insufficient documentation

## 2014-08-30 DIAGNOSIS — N83201 Unspecified ovarian cyst, right side: Secondary | ICD-10-CM

## 2014-08-30 DIAGNOSIS — R109 Unspecified abdominal pain: Secondary | ICD-10-CM | POA: Diagnosis present

## 2014-08-30 DIAGNOSIS — R1031 Right lower quadrant pain: Secondary | ICD-10-CM

## 2014-08-30 DIAGNOSIS — R630 Anorexia: Secondary | ICD-10-CM | POA: Diagnosis not present

## 2014-08-30 LAB — WET PREP, GENITAL
Clue Cells Wet Prep HPF POC: NONE SEEN
Trich, Wet Prep: NONE SEEN
WBC, Wet Prep HPF POC: NONE SEEN
Yeast Wet Prep HPF POC: NONE SEEN

## 2014-08-30 LAB — CBC WITH DIFFERENTIAL/PLATELET
BASOS ABS: 0 10*3/uL (ref 0.0–0.1)
Basophils Relative: 0 % (ref 0–1)
EOS PCT: 1 % (ref 0–5)
Eosinophils Absolute: 0.1 10*3/uL (ref 0.0–0.7)
HCT: 41.3 % (ref 36.0–46.0)
Hemoglobin: 13.8 g/dL (ref 12.0–15.0)
LYMPHS ABS: 2.6 10*3/uL (ref 0.7–4.0)
LYMPHS PCT: 29 % (ref 12–46)
MCH: 30.5 pg (ref 26.0–34.0)
MCHC: 33.4 g/dL (ref 30.0–36.0)
MCV: 91.4 fL (ref 78.0–100.0)
Monocytes Absolute: 0.6 10*3/uL (ref 0.1–1.0)
Monocytes Relative: 6 % (ref 3–12)
NEUTROS ABS: 5.7 10*3/uL (ref 1.7–7.7)
Neutrophils Relative %: 64 % (ref 43–77)
Platelets: 366 10*3/uL (ref 150–400)
RBC: 4.52 MIL/uL (ref 3.87–5.11)
RDW: 13.9 % (ref 11.5–15.5)
WBC: 9 10*3/uL (ref 4.0–10.5)

## 2014-08-30 LAB — COMPREHENSIVE METABOLIC PANEL
ALK PHOS: 50 U/L (ref 39–117)
ALT: 15 U/L (ref 0–35)
AST: 14 U/L (ref 0–37)
Albumin: 3.5 g/dL (ref 3.5–5.2)
Anion gap: 12 (ref 5–15)
BILIRUBIN TOTAL: 0.6 mg/dL (ref 0.3–1.2)
BUN: 7 mg/dL (ref 6–23)
CO2: 25 mmol/L (ref 19–32)
CREATININE: 0.57 mg/dL (ref 0.50–1.10)
Calcium: 8.4 mg/dL (ref 8.4–10.5)
Chloride: 104 mmol/L (ref 96–112)
GFR calc Af Amer: >90 mL/min (ref >90)
Glucose, Bld: 102 mg/dL — ABNORMAL HIGH (ref 70–99)
POTASSIUM: 3.1 mmol/L — AB (ref 3.5–5.1)
Sodium: 141 mmol/L (ref 135–145)
Total Protein: 5.7 g/dL — ABNORMAL LOW (ref 6.0–8.3)

## 2014-08-30 LAB — LIPASE, BLOOD: Lipase: 23 U/L (ref 11–59)

## 2014-08-30 MED ORDER — PROMETHAZINE HCL 25 MG PO TABS: 25.0000 mg | ORAL_TABLET | Freq: Four times a day (QID) | ORAL | Status: DC | PRN

## 2014-08-30 MED ORDER — ONDANSETRON HCL 4 MG/2ML IJ SOLN
4.0000 mg | Freq: Once | INTRAMUSCULAR | Status: AC
  Administered 2014-08-30: 4 mg via INTRAVENOUS
  Filled 2014-08-30: qty 2

## 2014-08-30 MED ORDER — OXYCODONE-ACETAMINOPHEN 5-325 MG PO TABS
1.0000 | ORAL_TABLET | Freq: Once | ORAL | Status: AC
  Administered 2014-08-30: 1 via ORAL

## 2014-08-30 MED ORDER — IOHEXOL 300 MG/ML  SOLN
100.0000 mL | Freq: Once | INTRAMUSCULAR | Status: AC | PRN
  Administered 2014-08-30: 100 mL via INTRAVENOUS

## 2014-08-30 MED ORDER — HYDROCODONE-ACETAMINOPHEN 5-325 MG PO TABS: 1.0000 | ORAL_TABLET | ORAL | Status: DC | PRN

## 2014-08-30 MED ORDER — IOHEXOL 300 MG/ML  SOLN
25.0000 mL | INTRAMUSCULAR | Status: AC
  Administered 2014-08-30: 25 mL via ORAL

## 2014-08-30 MED ORDER — OXYCODONE-ACETAMINOPHEN 5-325 MG PO TABS
1.0000 | ORAL_TABLET | Freq: Once | ORAL | Status: AC
  Administered 2014-08-30: 1 via ORAL
  Filled 2014-08-30: qty 1

## 2014-08-30 MED ORDER — OXYCODONE-ACETAMINOPHEN 5-325 MG PO TABS
ORAL_TABLET | ORAL | Status: AC
  Filled 2014-08-30: qty 1

## 2014-08-30 MED ORDER — OXYCODONE-ACETAMINOPHEN 5-325 MG PO TABS
2.0000 | ORAL_TABLET | Freq: Once | ORAL | Status: AC
  Administered 2014-08-30: 2 via ORAL
  Filled 2014-08-30: qty 2

## 2014-08-30 MED ORDER — HYDROMORPHONE HCL 1 MG/ML IJ SOLN
1.0000 mg | Freq: Once | INTRAMUSCULAR | Status: AC
  Administered 2014-08-30: 1 mg via INTRAVENOUS
  Filled 2014-08-30: qty 1

## 2014-08-30 MED ORDER — POTASSIUM CHLORIDE CRYS ER 20 MEQ PO TBCR
40.0000 meq | EXTENDED_RELEASE_TABLET | Freq: Once | ORAL | Status: AC
  Administered 2014-08-30: 40 meq via ORAL
  Filled 2014-08-30: qty 2

## 2014-08-30 MED ORDER — MORPHINE SULFATE 4 MG/ML IJ SOLN
4.0000 mg | Freq: Once | INTRAMUSCULAR | Status: DC
  Filled 2014-08-30: qty 1

## 2014-08-30 MED ORDER — ONDANSETRON 4 MG PO TBDP
4.0000 mg | ORAL_TABLET | Freq: Once | ORAL | Status: AC
  Administered 2014-08-30: 4 mg via ORAL
  Filled 2014-08-30: qty 1

## 2014-08-30 MED ORDER — SODIUM CHLORIDE 0.9 % IV BOLUS (SEPSIS)
1000.0000 mL | Freq: Once | INTRAVENOUS | Status: AC
  Administered 2014-08-30: 1000 mL via INTRAVENOUS

## 2014-08-30 NOTE — ED Provider Notes (Signed)
CSN: [id number]     Arrival date & time 08/30/14  0440 History   First MD Initiated Contact with Patient 08/30/14 [phone]     Chief Complaint  Patient presents with  . Abdominal Pain     (Consider location/radiation/quality/duration/timing/severity/associated sxs/prior Treatment) HPI  Pt is a 44yo female with hx of intermittent right sided abdominal pain for the last 2 months.  Pt was seen and evaluated in ED on 07/07/14, there was concern for appendicitis at that time due to severity and location of the pain. Pt states that pain did resolve one its own, she has not f/u with her PCP or a GI specialist, however, this current abdominal pain started yesterday and has gradually worsened.  Pain is worse in RLQ, aching, sharp, and stabbing 7/10 at worst, no relief with 600mg  ibuprofen.  Triage note states pt denied vomiting or diarrhea, however, on exam, pt c/o nausea and vomiting 3 times since yesterday. Denies diarrhea or constipation. Reports having cholecystectomy via laparoscopy on 01/28/14 [facility].  Pt states she has not f/u with her surgeon since last year as Right sided abdominal pain only started a few months ago. Abdominal surgical hx also significant for laparscopic gastric banding and abdominal hysterectomy. Denies urinary or vaginal symptoms. No previous hx of ovarian cyst or fibroid.   Past Medical History  Diagnosis Date  . Depression    Past Surgical History  Procedure Laterality Date  . Knee surgery    . Abdominal hysterectomy    . Laparoscopic gastric banding N/A 2013    [place] in [place]  . Cholecystectomy N/A 01/28/2014    Procedure: LAPAROSCOPIC CHOLECYSTECTOMY WITH INTRAOPERATIVE CHOLANGIOGRAM;  Surgeon: Valarie MerinoMatthew B Martin, MD;  Location: [facility];  Service: General;  Laterality: N/A;   No family history on file. History  Substance Use Topics  . Smoking status: Never Smoker   . Smokeless tobacco: Never Used  . Alcohol Use: No   OB History    No data available      Review of Systems  Constitutional: Positive for appetite change. Negative for fever, chills and diaphoresis.  Respiratory: Negative for cough and shortness of breath.   Cardiovascular: Negative for chest pain, palpitations and leg swelling.  Gastrointestinal: Positive for nausea, vomiting and abdominal pain. Negative for diarrhea, constipation and blood in stool.  Genitourinary: Negative for dysuria, urgency, frequency, hematuria, flank pain, decreased urine volume, vaginal bleeding, vaginal discharge, vaginal pain, menstrual problem and pelvic pain.  Musculoskeletal: Negative for myalgias and back pain.  All other systems reviewed and are negative.     Allergies  Sulfa antibiotics  Home Medications   Prior to Admission medications   Medication Sig Start Date End Date Taking? Authorizing Provider  acetaminophen (TYLENOL) 500 MG tablet Take 500-1,500 mg by mouth every 6 (six) hours as needed for moderate pain.   Yes Historical Provider, MD  ibuprofen (ADVIL,MOTRIN) 200 MG tablet Take 600 mg by mouth every 8 (eight) hours as needed for mild pain or moderate pain.    Yes Historical Provider, MD  HYDROcodone-acetaminophen (NORCO/VICODIN) 5-325 MG per tablet Take 1 tablet by mouth every 4 (four) hours as needed. Patient not taking: Reported on 08/30/2014 07/08/14   Layla MawKristen N Ward, DO  HYDROcodone-acetaminophen (NORCO/VICODIN) 5-325 MG per tablet Take 1-2 tablets by mouth every 4 (four) hours as needed. 08/30/14   Junius FinnerErin O'Malley, PA-C  ondansetron (ZOFRAN ODT) 4 MG disintegrating tablet Take 1 tablet (4 mg total) by mouth every 8 (eight) hours as  needed for nausea or vomiting. Patient not taking: Reported on 08/30/2014 07/08/14   Kristen N Ward, DO  ondansetron (ZOFRAN) 4 MG tablet Take 1 tablet (4 mg total) by mouth every 6 (six) hours. Patient not taking: Reported on 07/07/2014 01/25/14   Terri Piedra, PA-C  oxyCODONE-acetaminophen (PERCOCET/ROXICET) 5-325 MG per tablet Take 1-2 tablets by  mouth every 4 (four) hours as needed for moderate pain or severe pain. Patient not taking: Reported on 07/07/2014 01/25/14   Terri Piedra, PA-C  oxyCODONE-acetaminophen (PERCOCET/ROXICET) 5-325 MG per tablet Take 1-2 tablets by mouth every 4 (four) hours as needed for moderate pain. Patient not taking: Reported on 07/07/2014 01/29/14   Luretha Murphy, MD  promethazine (PHENERGAN) 25 MG tablet Take 1 tablet (25 mg total) by mouth every 6 (six) hours as needed for nausea or vomiting. 08/30/14   Junius Finner, PA-C  sertraline (ZOLOFT) 100 MG tablet Take 100 mg by mouth at bedtime.     Historical Provider, MD   BP 137/79 mmHg  Pulse 66  Temp(Src) 97.8 F (36.6 C) (Oral)  Resp 18  SpO2 96% Physical Exam  Constitutional: She appears well-developed and well-nourished. No distress.  Pt lying in exam bed, NAD  HENT:  Head: Normocephalic and atraumatic.  Eyes: Conjunctivae are normal. No scleral icterus.  Neck: Normal range of motion.  Cardiovascular: Normal rate, regular rhythm and normal heart sounds.   Pulmonary/Chest: Effort normal and breath sounds normal. No respiratory distress. She has no wheezes. She has no rales. She exhibits no tenderness.  Abdominal: Soft. Bowel sounds are normal. She exhibits no distension and no mass. There is tenderness. There is guarding. There is no rebound.  Soft, non-distended, tenderness from RUQ to RLQ, worse in RLQ with guarding.  No masses palpated  Genitourinary:  Chaperoned exam.  Normal external genitalia. Vaginal canal: normal, no vaginal bleeding or discharge. No CMT, right adnexal tenderness present, no masses palpated. No left adnexal tenderness or masses.  Right adnexal tenderness  Musculoskeletal: Normal range of motion.  Neurological: She is alert.  Skin: Skin is warm and dry. She is not diaphoretic.  Nursing note and vitals reviewed.   ED Course  Procedures (including critical care time) Labs Review Labs Reviewed  COMPREHENSIVE METABOLIC  PANEL - Abnormal; Notable for the following:    Potassium 3.1 (*)    Glucose, Bld 102 (*)    Total Protein 5.7 (*)    All other components within normal limits  WET PREP, GENITAL  CBC WITH DIFFERENTIAL/PLATELET  LIPASE, BLOOD    Imaging Review Ct Abdomen Pelvis W Contrast  08/30/2014   CLINICAL DATA:  Per Patient: Reports she is having RLQ pain. Reports pain started yesterday and became worse yesterday evening prior to going to bed. Pt endorses nausea denies vomiting and diarrhea. Ax4, NAD.  EXAM: CT ABDOMEN AND PELVIS WITH CONTRAST  TECHNIQUE: Multidetector CT imaging of the abdomen and pelvis was performed using the standard protocol following bolus administration of intravenous contrast.  CONTRAST:  OMNIPAQUE IOHEXOL 300 MG/ML  SOLN  COMPARISON:  07/07/2014 and 11/09/2008  FINDINGS: Lung bases are normal.  Abdominal images demonstrate lap band apparatus over gastroesophageal junction with associated subcutaneous port over the left anterior mid abdominal wall. There is evidence of a previous cholecystectomy. Minimal air over the distal common bile duct in the region of the ampulla unchanged.  The liver, spleen, pancreas and adrenal glands are otherwise within normal. Kidneys are normal in size without evidence of hydronephrosis. There are a few  punctate calcifications over the mid to upper pole corticomedullary junction of the left kidney. Appendix is normal. Vascular structures are unremarkable. Mesentery is within normal. Large and small bowel are within normal.  Pelvic images demonstrate surgical absence of the uterus. Left ovary is not visualized. Right ovary demonstrates a simple appearing 4 cm cyst. There are several phleboliths. The bladder is decompressed. Rectum is normal. Remaining bones and soft tissues are otherwise within normal.  IMPRESSION: No acute findings in the abdomen/pelvis.  Few punctate calcifications over the corticomedullary junction of the left renal midpole likely tiny  stones.  4 cm simple appearing right ovarian cyst.  Stable postsurgical changes as described including stable lap band apparatus.   Electronically Signed   By: Elberta Fortis M.D.   On: 08/30/2014 14:48     EKG Interpretation None      MDM   Final diagnoses:  RLQ abdominal pain  Right ovarian cyst  Non-intractable vomiting with nausea, vomiting of unspecified type    Pt is a 44yo female with c/o intermittent right sided abdominal pain for 2 months, worse in RLQ.  Pain today started yesterday. Pain is sharp and aching. Pt did have a normal appendix on CT abdomen in January, pt is afebrile here, however, pt is severely tender in RLQ.  Pt also has hx of gastric banding, cholecystectomy, and abdominal hysterectomy.    Pt was given two 5-325 percocet in triage, which did not help with the pain.   Labs:  Mild hypokalemia, otherwise unremarkable.   CT abd: no acute findings in abdomen/pelvis including normal appendix.  Significant for 4cm simple appearing Right ovarian cyst. Stable postsurgical changes, including hysterectomy and stable lap band apparatus.  Pt is not tender in epigastrium or left side of abdomen.    Pelvic exam: no vaginal bleeding or discharge. Right adnexa tenderness c/o ovarian cyst. Doubt ovarian torsion or tubal ovarian abscess.  Discussed pt with Dr. Madilyn Hook, will have pt f/u with OB/GYN as well as her general surgeon who performed her cholecystectomy if right sided abdominal pain persists. Rx: norco and phenergan.  Home care instructions provided. Return precautions provided. Pt verbalized understanding and agreement with tx plan.       Junius Finner, PA-C 08/30/14 1640  Tilden Fossa, MD 08/30/14 [phone]

## 2014-08-30 NOTE — ED Notes (Signed)
Pt reports taking 600mg  of Ibuprofen at 2am. Reports this medication did not relief her pain, did relief her fever.

## 2014-08-30 NOTE — ED Notes (Signed)
Per Patient: Reports she is having RLQ pain. Reports pain started yesterday and became worse yesterday evening prior to going to bed. Pt endorses nausea denies vomiting and diarrhea. Ax4, NAD.

## 2014-08-31 LAB — GC/CHLAMYDIA PROBE AMP (~~LOC~~) NOT AT ARMC
Chlamydia: NEGATIVE
Neisseria Gonorrhea: NEGATIVE

## 2014-09-01 ENCOUNTER — Ambulatory Visit: Payer: Medicaid Other | Attending: Family Medicine | Admitting: Family Medicine

## 2014-09-01 ENCOUNTER — Encounter: Payer: Self-pay | Admitting: Family Medicine

## 2014-09-01 VITALS — BP 155/98 | HR 69 | Temp 97.9°F | Resp 16 | Ht 71.0 in | Wt 249.0 lb

## 2014-09-01 DIAGNOSIS — J45901 Unspecified asthma with (acute) exacerbation: Secondary | ICD-10-CM | POA: Insufficient documentation

## 2014-09-01 DIAGNOSIS — Z9889 Other specified postprocedural states: Secondary | ICD-10-CM | POA: Insufficient documentation

## 2014-09-01 DIAGNOSIS — R030 Elevated blood-pressure reading, without diagnosis of hypertension: Secondary | ICD-10-CM | POA: Insufficient documentation

## 2014-09-01 DIAGNOSIS — N8320 Unspecified ovarian cysts: Secondary | ICD-10-CM | POA: Insufficient documentation

## 2014-09-01 DIAGNOSIS — R1031 Right lower quadrant pain: Secondary | ICD-10-CM | POA: Insufficient documentation

## 2014-09-01 DIAGNOSIS — E669 Obesity, unspecified: Secondary | ICD-10-CM | POA: Insufficient documentation

## 2014-09-01 HISTORY — DX: Unspecified asthma with (acute) exacerbation: J45.901

## 2014-09-01 HISTORY — DX: Other specified postprocedural states: Z98.890

## 2014-09-01 LAB — POCT URINALYSIS DIPSTICK
Bilirubin, UA: NEGATIVE
Glucose, UA: NEGATIVE
Ketones, UA: 15
LEUKOCYTES UA: NEGATIVE
Nitrite, UA: NEGATIVE
PROTEIN UA: NEGATIVE
Spec Grav, UA: 1.01
Urobilinogen, UA: 0.2
pH, UA: 6

## 2014-09-01 NOTE — Patient Instructions (Addendum)
Follow instructions per hospital. Make an appoint with primary doctor assigned for one month, sooner if needed. Your blood pressure is elevated today. Come back for a nurse visit in one week to have rechecked

## 2014-09-01 NOTE — Progress Notes (Signed)
Patient ID: Melissa Dodson, female   DOB: October 12, 1970, 44 y.o.   MRN: [id number]   Patient ID: Melissa Dodson, female   DOB: 07/12/1970, 44 y.o.   MRN: [id number]   NWGNFA Complaint:  Abdominal pain  Subjective:  Patient presents for a ED follow-up for intractible vomiting and right lower quadrant pain. She was treated and n/v has resolved. She was diagnosed with an ovarian list and sent here to establish care and get a referral to GYN.She has a history of cholecystectomy and gastric banding. She denies being on chronic medications other than OTC. Wet prep and STD screen negative from ED.BP was normal in ED but is elevated here today.   ROS:  GEN:                Denies fever, chills,WT loss, fever Skin:                 Denies lesions or rashes HENT:              Denies  earache, epistaxis, sore throat, or neck pain, or headaches                 LUNGS:            Denies SOB with rest or walking CV:                   Denies CP or palpitations ABD:                 aDMITS TO abdominal pain, nausea,and vomiting             EXT:                 Denies muscle spasms or swelling; no pain in lower ext, no weakness,    NEURO:           Denies numbness or tingling, denies sz, Hx of stroke.  Objective:     Filed Vitals:    09/01/14 1111  BP:  155/98  Pulse:  69  Temp:  97.9 F (36.6 C)  Resp:  16  Height:   (1.803 m)  Weight:  249 lb (112.946 kg)  SpO2:  100%    Physical Exam: General:           in no acute distress. HEENT:            no pallor, no icterus, moist oral mucosa, no  lymphadenopathy Heart:               Normal  s1 &s2  Regular rate and rhythm, without M,G,R Lungs:              Clear to auscultation bilaterally. Abdomen:         Soft, nontender, nondistended, positive bowel sounds. Exetremeties:  No pedal edema,pedal pulses normal. Neuro:              Alert, awake, oriented x3, nonfocal.    Medications:  Prior to Admission medications   Medication   Sig  Start Date  End Date  Taking?  Authorizing Provider  HYDROcodone-acetaminophen (NORCO/VICODIN) 5-325 MG per tablet  Take 1-2 tablets by mouth every 4 (four) hours as needed.  08/30/14    Yes  Junius Finner, PA-C  ibuprofen (ADVIL,MOTRIN) 200 MG tablet  Take 600 mg by mouth every 8 (eight) hours as needed for mild pain or moderate pain.       Yes  Historical Provider, MD  promethazine (PHENERGAN) 25 MG tablet  Take 1 tablet (25 mg total) by mouth every 6 (six) hours as needed for nausea or vomiting.  08/30/14    Yes  Junius FinnerErin O'Malley, PA-C    Assessment: 1. Ovarian Cyst 2. Elevated BP 3.   Plan: 1.Referral to GYN 2. Nurse visit for recheck in one week.  Follow up:  The patient was given clear instructions to go to ER or return to medical center if symptoms don't improve, worsen or new problems develop. The patient verbalized understanding. The patient was told to call to get lab results if they haven't heard anything in the next week.   This note has been created with Education officer, environmentalDragon speech recognition software and smart phrase technology. Any transcriptional errors are unintentional.   Henrietta HooverLinda C. Melchizedek Espinola, FNP,BC 09/01/2014, 11:28 AM

## 2014-09-01 NOTE — Progress Notes (Signed)
Pt is a 44yo female with hx of intermittent right sided abdominal pain for the last 2 months. Pt was seen and evaluated in ED on 07/07/14, there was concern for appendicitis at that time due to severity and location of the pain. Pt states that pain did resolve one its own, she has not f/u with her PCP or a GI specialist, however, this current abdominal pain started yesterday and has gradually worsened. Pain is worse in RLQ, aching, sharp, and stabbing 7/10 at worst, no relief with 600mg  ibuprofen. Triage note states pt denied vomiting or diarrhea, however, on exam, pt c/o nausea and vomiting 3 times since yesterday. Denies diarrhea or constipation. Reports having cholecystectomy via laparoscopy on 01/28/14 [facility]. Pt states she has not f/u with her surgeon since last year as Right sided abdominal pain only started a few months ago. Abdominal surgical hx also significant for laparscopic gastric banding and abdominal hysterectomy. Denies urinary or vaginal symptoms. No previous hx of ovarian cyst or fibroid.   Patient has medicaid and needs referral to OB/GYN Patient has had hysterectomy for endometriosis and no longer has a cycle

## 2014-09-02 ENCOUNTER — Inpatient Hospital Stay (HOSPITAL_COMMUNITY)
Admission: AD | Admit: 2014-09-02 | Discharge: 2014-09-03 | Disposition: A | Discharge status: Home / Self Care | Payer: Medicaid Other | Source: Ambulatory Visit | Attending: Obstetrics and Gynecology | Admitting: Obstetrics and Gynecology

## 2014-09-02 ENCOUNTER — Encounter (HOSPITAL_COMMUNITY): Payer: Self-pay | Admitting: *Deleted

## 2014-09-02 DIAGNOSIS — N8320 Unspecified ovarian cysts: Secondary | ICD-10-CM | POA: Diagnosis not present

## 2014-09-02 DIAGNOSIS — Z9049 Acquired absence of other specified parts of digestive tract: Secondary | ICD-10-CM | POA: Diagnosis not present

## 2014-09-02 DIAGNOSIS — R109 Unspecified abdominal pain: Secondary | ICD-10-CM | POA: Insufficient documentation

## 2014-09-02 DIAGNOSIS — N83201 Unspecified ovarian cyst, right side: Secondary | ICD-10-CM

## 2014-09-02 NOTE — MAU Note (Signed)
[facility] months ago and everything ok. Pain has continued and seen at ED 2 days ago. Saw large mass R ovary. Had partial hysterectomy and do not have L ovary. [facility] and was told would get call for appt with provider. Having a lot of abd pain. Having occ spotting that is pink color

## 2014-09-02 NOTE — MAU Note (Signed)
Pt now states she has been having fevers off and on. Does not take temp "but i can feel when i have a fever"

## 2014-09-03 DIAGNOSIS — N8320 Unspecified ovarian cysts: Secondary | ICD-10-CM | POA: Diagnosis not present

## 2014-09-03 LAB — URINALYSIS, ROUTINE W REFLEX MICROSCOPIC
Bilirubin Urine: NEGATIVE
GLUCOSE, UA: NEGATIVE mg/dL
Hgb urine dipstick: NEGATIVE
KETONES UR: NEGATIVE mg/dL
Leukocytes, UA: NEGATIVE
NITRITE: NEGATIVE
Protein, ur: NEGATIVE mg/dL
Specific Gravity, Urine: 1.015 (ref 1.005–1.030)
Urobilinogen, UA: 0.2 mg/dL (ref 0.0–1.0)
pH: 5.5 (ref 5.0–8.0)

## 2014-09-03 MED ORDER — KETOROLAC TROMETHAMINE 60 MG/2ML IM SOLN
60.0000 mg | Freq: Once | INTRAMUSCULAR | Status: AC
  Administered 2014-09-03: 60 mg via INTRAMUSCULAR
  Filled 2014-09-03: qty 2

## 2014-09-03 NOTE — Progress Notes (Signed)
Bimanual only done 

## 2014-09-03 NOTE — MAU Provider Note (Signed)
History     CSN: [id number]  Arrival date and time: 09/02/14 2306   First Provider Initiated Contact with Patient 09/03/14 0022      Chief Complaint  Patient presents with  . Abdominal Pain   HPI  44 y.o. G2P1100 presents to the MAU with complaint of right sided abdominal pain. She was diagnosed with an right ovarian cyst in the ED 2 days ago. She has a history of cholecystectomy and gastric banding. She denies being on chronic medications other than OTC. Wet prep and STD screen negative from ED.  Past Medical History  Diagnosis Date  . Depression   . Asthma with exacerbation 09/01/2014    Past Surgical History  Procedure Laterality Date  . Knee surgery    . Abdominal hysterectomy    . Laparoscopic gastric banding N/A 2013    performed in [place]  . Cholecystectomy N/A 01/28/2014    Procedure: LAPAROSCOPIC CHOLECYSTECTOMY WITH INTRAOPERATIVE CHOLANGIOGRAM;  Surgeon: Valarie Merino, MD;  Location: [facility];  Service: General;  Laterality: N/A;    Family History  Problem Relation Age of Onset  . Adopted: Yes  . Family history unknown: Yes    History  Substance Use Topics  . Smoking status: Never Smoker   . Smokeless tobacco: Never Used  . Alcohol Use: Yes     Comment: social on occ    Allergies:  Allergies  Allergen Reactions  . Sulfa Antibiotics Hives    Prescriptions prior to admission  Medication Sig Dispense Refill Last Dose  . HYDROcodone-acetaminophen (NORCO/VICODIN) 5-325 MG per tablet Take 1-2 tablets by mouth every 4 (four) hours as needed. 15 tablet 0 09/02/2014 at 1700  . ibuprofen (ADVIL,MOTRIN) 200 MG tablet Take 600 mg by mouth every 8 (eight) hours as needed for mild pain or moderate pain.    09/01/2014 at Unknown time  . promethazine (PHENERGAN) 25 MG tablet Take 1 tablet (25 mg total) by mouth every 6 (six) hours as needed for nausea or vomiting. 10 tablet 0 09/02/2014 at Unknown time    Review of Systems  Constitutional: Negative.   HENT:  Negative.   Eyes: Negative.   Cardiovascular: Negative.   Gastrointestinal: Positive for abdominal pain.  Genitourinary: Negative.   Musculoskeletal: Negative.   Skin: Negative.   Neurological: Negative.   Endo/Heme/Allergies: Negative.   Psychiatric/Behavioral: Negative.    Physical Exam   Blood pressure 138/63, pulse 71, temperature 97.9 F (36.6 C), resp. rate 18, height  (1.803 m), weight 114.306 kg (252 lb), SpO2 98 %.  Physical Exam  Constitutional: She is oriented to person, place, and time. She appears well-developed and well-nourished. No distress.  HENT:  Head: Normocephalic and atraumatic.  Neck: Normal range of motion.  Cardiovascular: Normal rate.   Respiratory: Effort normal. No respiratory distress.  GI: Soft. She exhibits no mass. There is tenderness. There is no rebound and no guarding.  rlq  Genitourinary:  Uterus and left ovary surgically absent.  Musculoskeletal: Normal range of motion.  Neurological: She is alert and oriented to person, place, and time.  Skin: Skin is warm and dry.  Psychiatric: She has a normal mood and affect. Her behavior is normal. Judgment and thought content normal.    MAU Course  Procedures  MDM Results for orders placed or performed during the hospital encounter of 09/02/14 (from the past 24 hour(s))  Urinalysis, Routine w reflex microscopic     Status: None   Collection Time: 09/02/14 11:33 PM  Result Value Ref  Range   Color, Urine YELLOW YELLOW   APPearance CLEAR CLEAR   Specific Gravity, Urine 1.015 1.005 - 1.030   pH 5.5 5.0 - 8.0   Glucose, UA NEGATIVE NEGATIVE mg/dL   Hgb urine dipstick NEGATIVE NEGATIVE   Bilirubin Urine NEGATIVE NEGATIVE   Ketones, ur NEGATIVE NEGATIVE mg/dL   Protein, ur NEGATIVE NEGATIVE mg/dL   Urobilinogen, UA 0.2 0.0 - 1.0 mg/dL   Nitrite NEGATIVE NEGATIVE   Leukocytes, UA NEGATIVE NEGATIVE   Results for orders placed or performed during the hospital encounter of 09/02/14 (from the  past 72 hour(s))  Urinalysis, Routine w reflex microscopic     Status: None   Collection Time: 09/02/14 11:33 PM  Result Value Ref Range   Color, Urine YELLOW YELLOW   APPearance CLEAR CLEAR   Specific Gravity, Urine 1.015 1.005 - 1.030   pH 5.5 5.0 - 8.0   Glucose, UA NEGATIVE NEGATIVE mg/dL   Hgb urine dipstick NEGATIVE NEGATIVE   Bilirubin Urine NEGATIVE NEGATIVE   Ketones, ur NEGATIVE NEGATIVE mg/dL   Protein, ur NEGATIVE NEGATIVE mg/dL   Urobilinogen, UA 0.2 0.0 - 1.0 mg/dL   Nitrite NEGATIVE NEGATIVE   Leukocytes, UA NEGATIVE NEGATIVE    Comment: MICROSCOPIC NOT DONE ON URINES WITH NEGATIVE PROTEIN, BLOOD, LEUKOCYTES, NITRITE, OR GLUCOSE <1000 mg/dL.  Ct Abdomen Pelvis W Contrast  08/30/2014   CLINICAL DATA:  Per Patient: Reports she is having RLQ pain. Reports pain started yesterday and became worse yesterday evening prior to going to bed. Pt endorses nausea denies vomiting and diarrhea. Ax4, NAD.  EXAM: CT ABDOMEN AND PELVIS WITH CONTRAST  TECHNIQUE: Multidetector CT imaging of the abdomen and pelvis was performed using the standard protocol following bolus administration of intravenous contrast.  CONTRAST:  100mL OMNIPAQUE IOHEXOL 300 MG/ML  SOLN  COMPARISON:  07/07/2014 and 11/09/2008  FINDINGS: Lung bases are normal.  Abdominal images demonstrate lap band apparatus over gastroesophageal junction with associated subcutaneous port over the left anterior mid abdominal wall. There is evidence of a previous cholecystectomy. Minimal air over the distal common bile duct in the region of the ampulla unchanged.  The liver, spleen, pancreas and adrenal glands are otherwise within normal. Kidneys are normal in size without evidence of hydronephrosis. There are a few punctate calcifications over the mid to upper pole corticomedullary junction of the left kidney. Appendix is normal. Vascular structures are unremarkable. Mesentery is within normal. Large and small bowel are within normal.  Pelvic  images demonstrate surgical absence of the uterus. Left ovary is not visualized. Right ovary demonstrates a simple appearing 4 cm cyst. There are several phleboliths. The bladder is decompressed. Rectum is normal. Remaining bones and soft tissues are otherwise within normal.  IMPRESSION: No acute findings in the abdomen/pelvis.  Few punctate calcifications over the corticomedullary junction of the left renal midpole likely tiny stones.  4 cm simple appearing right ovarian cyst.  Stable postsurgical changes as described including stable lap band apparatus.   Electronically Signed   By: Elberta Fortisaniel  Boyle M.D.   On: 08/30/2014 14:48     Toradol 60mg  Im  Assessment and Plan  Right Ovarian Cyst  Follow up with [facility]- Scheduled urgent appointment for 300pm on Monday September 05, 2014 Discharge to home   [facility] HospitalClemmons,Arieonna Medine Grissett 09/03/2014, 12:28 AM

## 2014-09-03 NOTE — Discharge Instructions (Signed)
Ovarian Cyst An ovarian cyst is a fluid-filled sac that forms on an ovary. The ovaries are small organs that produce eggs in women. Various types of cysts can form on the ovaries. Most are not cancerous. Many do not cause problems, and they often go away on their own. Some may cause symptoms and require treatment. Common types of ovarian cysts include:  Functional cysts--These cysts may occur every month during the menstrual cycle. This is normal. The cysts usually go away with the next menstrual cycle if the woman does not get pregnant. Usually, there are no symptoms with a functional cyst.  Endometrioma cysts--These cysts form from the tissue that lines the uterus. They are also called "chocolate cysts" because they become filled with blood that turns brown. This type of cyst can cause pain in the lower abdomen during intercourse and with your menstrual period.  Cystadenoma cysts--This type develops from the cells on the outside of the ovary. These cysts can get very big and cause lower abdomen pain and pain with intercourse. This type of cyst can twist on itself, cut off its blood supply, and cause severe pain. It can also easily rupture and cause a lot of pain.  Dermoid cysts--This type of cyst is sometimes found in both ovaries. These cysts may contain different kinds of body tissue, such as skin, teeth, hair, or cartilage. They usually do not cause symptoms unless they get very big.  Theca lutein cysts--These cysts occur when too much of a certain hormone (human chorionic gonadotropin) is produced and overstimulates the ovaries to produce an egg. This is most common after procedures used to assist with the conception of a baby (in vitro fertilization). CAUSES   Fertility drugs can cause a condition in which multiple large cysts are formed on the ovaries. This is called ovarian hyperstimulation syndrome.  A condition called polycystic ovary syndrome can cause hormonal imbalances that can lead to  nonfunctional ovarian cysts. SIGNS AND SYMPTOMS  Many ovarian cysts do not cause symptoms. If symptoms are present, they may include:  Pelvic pain or pressure.  Pain in the lower abdomen.  Pain during sexual intercourse.  Increasing girth (swelling) of the abdomen.  Abnormal menstrual periods.  Increasing pain with menstrual periods.  Stopping having menstrual periods without being pregnant. DIAGNOSIS  These cysts are commonly found during a routine or annual pelvic exam. Tests may be ordered to find out more about the cyst. These tests may include:  Ultrasound.  X-ray of the pelvis.  CT scan.  MRI.  Blood tests. TREATMENT  Many ovarian cysts go away on their own without treatment. Your health care provider may want to check your cyst regularly for 2-3 months to see if it changes. For women in menopause, it is particularly important to monitor a cyst closely because of the higher rate of ovarian cancer in menopausal women. When treatment is needed, it may include any of the following:  A procedure to drain the cyst (aspiration). This may be done using a long needle and ultrasound. It can also be done through a laparoscopic procedure. This involves using a thin, lighted tube with a tiny camera on the end (laparoscope) inserted through a small incision.  Surgery to remove the whole cyst. This may be done using laparoscopic surgery or an open surgery involving a larger incision in the lower abdomen.  Hormone treatment or birth control pills. These methods are sometimes used to help dissolve a cyst. HOME CARE INSTRUCTIONS   Only take over-the-counter   or prescription medicines as directed by your health care provider.  Follow up with your health care provider as directed.  Get regular pelvic exams and Pap tests. SEEK MEDICAL CARE IF:   Your periods are late, irregular, or painful, or they stop.  Your pelvic pain or abdominal pain does not go away.  Your abdomen becomes  larger or swollen.  You have pressure on your bladder or trouble emptying your bladder completely.  You have pain during sexual intercourse.  You have feelings of fullness, pressure, or discomfort in your stomach.  You lose weight for no apparent reason.  You feel generally ill.  You become constipated.  You lose your appetite.  You develop acne.  You have an increase in body and facial hair.  You are gaining weight, without changing your exercise and eating habits.  You think you are pregnant. SEEK IMMEDIATE MEDICAL CARE IF:   You have increasing abdominal pain.  You feel sick to your stomach (nauseous), and you throw up (vomit).  You develop a fever that comes on suddenly.  You have abdominal pain during a bowel movement.  Your menstrual periods become heavier than usual. MAKE SURE YOU:  Understand these instructions.  Will watch your condition.  Will get help right away if you are not doing well or get worse. Document Released: 06/03/2005 Document Revised: 06/08/2013 Document Reviewed: 02/08/2013 ExitCare Patient Information 2015 [employer]. This information is not intended to replace advice given to you by your health care provider. Make sure you discuss any questions you have with your health care provider.  

## 2014-09-03 NOTE — Progress Notes (Signed)
Lori Clemmons CNM in earlier to discuss d/c plan with pt. Written and verbal d/c instructions given and understanding voiced.

## 2014-09-05 ENCOUNTER — Encounter: Payer: Self-pay | Admitting: Obstetrics & Gynecology

## 2014-09-05 ENCOUNTER — Ambulatory Visit (INDEPENDENT_AMBULATORY_CARE_PROVIDER_SITE_OTHER): Payer: Medicaid Other | Admitting: Obstetrics & Gynecology

## 2014-09-05 VITALS — BP 133/76 | HR 75 | Ht 71.0 in | Wt 250.3 lb

## 2014-09-05 DIAGNOSIS — N83201 Unspecified ovarian cyst, right side: Secondary | ICD-10-CM

## 2014-09-05 DIAGNOSIS — N8320 Unspecified ovarian cysts: Secondary | ICD-10-CM

## 2014-09-05 MED ORDER — TRAMADOL HCL 50 MG PO TABS: 50.0000 mg | ORAL_TABLET | Freq: Four times a day (QID) | ORAL | Status: DC | PRN

## 2014-09-05 MED ORDER — NAPROXEN 500 MG PO TABS: 500.0000 mg | ORAL_TABLET | Freq: Two times a day (BID) | ORAL | Status: DC

## 2014-09-05 NOTE — Patient Instructions (Signed)
Ovarian Cyst An ovarian cyst is a fluid-filled sac that forms on an ovary. The ovaries are small organs that produce eggs in women. Various types of cysts can form on the ovaries. Most are not cancerous. Many do not cause problems, and they often go away on their own. Some may cause symptoms and require treatment. Common types of ovarian cysts include:  Functional cysts--These cysts may occur every month during the menstrual cycle. This is normal. The cysts usually go away with the next menstrual cycle if the woman does not get pregnant. Usually, there are no symptoms with a functional cyst.  Endometrioma cysts--These cysts form from the tissue that lines the uterus. They are also called "chocolate cysts" because they become filled with blood that turns brown. This type of cyst can cause pain in the lower abdomen during intercourse and with your menstrual period.  Cystadenoma cysts--This type develops from the cells on the outside of the ovary. These cysts can get very big and cause lower abdomen pain and pain with intercourse. This type of cyst can twist on itself, cut off its blood supply, and cause severe pain. It can also easily rupture and cause a lot of pain.  Dermoid cysts--This type of cyst is sometimes found in both ovaries. These cysts may contain different kinds of body tissue, such as skin, teeth, hair, or cartilage. They usually do not cause symptoms unless they get very big.  Theca lutein cysts--These cysts occur when too much of a certain hormone (human chorionic gonadotropin) is produced and overstimulates the ovaries to produce an egg. This is most common after procedures used to assist with the conception of a baby (in vitro fertilization). CAUSES   Fertility drugs can cause a condition in which multiple large cysts are formed on the ovaries. This is called ovarian hyperstimulation syndrome.  A condition called polycystic ovary syndrome can cause hormonal imbalances that can lead to  nonfunctional ovarian cysts. SIGNS AND SYMPTOMS  Many ovarian cysts do not cause symptoms. If symptoms are present, they may include:  Pelvic pain or pressure.  Pain in the lower abdomen.  Pain during sexual intercourse.  Increasing girth (swelling) of the abdomen.  Abnormal menstrual periods.  Increasing pain with menstrual periods.  Stopping having menstrual periods without being pregnant. DIAGNOSIS  These cysts are commonly found during a routine or annual pelvic exam. Tests may be ordered to find out more about the cyst. These tests may include:  Ultrasound.  X-ray of the pelvis.  CT scan.  MRI.  Blood tests. TREATMENT  Many ovarian cysts go away on their own without treatment. Your health care provider may want to check your cyst regularly for 2-3 months to see if it changes. For women in menopause, it is particularly important to monitor a cyst closely because of the higher rate of ovarian cancer in menopausal women. When treatment is needed, it may include any of the following:  A procedure to drain the cyst (aspiration). This may be done using a long needle and ultrasound. It can also be done through a laparoscopic procedure. This involves using a thin, lighted tube with a tiny camera on the end (laparoscope) inserted through a small incision.  Surgery to remove the whole cyst. This may be done using laparoscopic surgery or an open surgery involving a larger incision in the lower abdomen.  Hormone treatment or birth control pills. These methods are sometimes used to help dissolve a cyst. HOME CARE INSTRUCTIONS   Only take over-the-counter   or prescription medicines as directed by your health care provider.  Follow up with your health care provider as directed.  Get regular pelvic exams and Pap tests. SEEK MEDICAL CARE IF:   Your periods are late, irregular, or painful, or they stop.  Your pelvic pain or abdominal pain does not go away.  Your abdomen becomes  larger or swollen.  You have pressure on your bladder or trouble emptying your bladder completely.  You have pain during sexual intercourse.  You have feelings of fullness, pressure, or discomfort in your stomach.  You lose weight for no apparent reason.  You feel generally ill.  You become constipated.  You lose your appetite.  You develop acne.  You have an increase in body and facial hair.  You are gaining weight, without changing your exercise and eating habits.  You think you are pregnant. SEEK IMMEDIATE MEDICAL CARE IF:   You have increasing abdominal pain.  You feel sick to your stomach (nauseous), and you throw up (vomit).  You develop a fever that comes on suddenly.  You have abdominal pain during a bowel movement.  Your menstrual periods become heavier than usual. MAKE SURE YOU:  Understand these instructions.  Will watch your condition.  Will get help right away if you are not doing well or get worse. Document Released: 06/03/2005 Document Revised: 06/08/2013 Document Reviewed: 02/08/2013 ExitCare Patient Information 2015 [employer]. This information is not intended to replace advice given to you by your health care provider. Make sure you discuss any questions you have with your health care provider.  

## 2014-09-05 NOTE — Progress Notes (Signed)
Subjective:     Patient ID: Benedetto CoonsJennifer E Al-Jaouni, female   DOB: [birth year], 44 y.o.   MRN: [id number]  HPI Pt c/o pain on right side.  She was seen in the ED in Jan and had a neg workup.  The pain persisted and she was seen again in Mar and had a CT which showed a 4 cm right ov cyst.  Pt reports that the pain began several months prev and was assoc with N/V.  She is s/p hyst which she had for endometriosis.  She reports that the pain is mainly on the right side but, she can also 'pinpoint' an area where there is pain on the left side.  She reports taking Motrin for the pain but says that it is not helping. She received Percocet in the ED but, is out of it presently.  Past Medical History  Diagnosis Date  . Depression   . Asthma with exacerbation 09/01/2014   Past Surgical History  Procedure Laterality Date  . Knee surgery    . Abdominal hysterectomy    . Laparoscopic gastric banding N/A 2013    [place]formed in [place]  . Cholecystectomy N/A 01/28/2014    Procedure: LAPAROSCOPIC CHOLECYSTECTOMY WITH INTRAOPERATIVE CHOLANGIOGRAM;  Surgeon: Valarie MerinoMatthew B Martin, MD;  Location: [facility];  Service: General;  Laterality: N/A;   Current Outpatient Prescriptions on File Prior to Visit  Medication Sig Dispense Refill  . ibuprofen (ADVIL,MOTRIN) 200 MG tablet Take 600 mg by mouth every 8 (eight) hours as needed for mild pain or moderate pain.     Marland Kitchen. HYDROcodone-acetaminophen (NORCO/VICODIN) 5-325 MG per tablet Take 1-2 tablets by mouth every 4 (four) hours as needed. (Patient not taking: Reported on 09/05/2014) 15 tablet 0  . promethazine (PHENERGAN) 25 MG tablet Take 1 tablet (25 mg total) by mouth every 6 (six) hours as needed for nausea or vomiting. (Patient not taking: Reported on 09/05/2014) 10 tablet 0   No current facility-administered medications on file prior to visit.   Allergies  Allergen Reactions  . Sulfa Antibiotics Hives       Review of Systems     Objective:   Physical Exam BP 133/76 mmHg   Pulse 75  Ht 5\' 11"  (1.803 m)  Wt 250 lb 4.8 oz (113.535 kg)  BMI 34.93 kg/m2 Lungs: CTA CV: RRR Abd: obese, ND, diffusely tender.  No rebound.  There is a tender area in the LLQ that is not relived with extension of the abd wall.  It appears to be superficial in nature and is not noted on internal exam. GU: EGBUS: no lesions Vagina: no blood in vault Cervix/uterus: surgically abbsent Adnexa: no masses palpable- may be limited by pts body habitus.  sl tender on bilaterally   06/2014 CLINICAL DATA: RIGHT-sided abdominal pain for ear 3 days, nausea, diarrhea and dizziness worsening today. History of cholecystectomy, hysterectomy and gastric banding.  EXAM: CT ABDOMEN AND PELVIS WITH CONTRAST  TECHNIQUE: Multidetector CT imaging of the abdomen and pelvis was performed using the standard protocol following bolus administration of intravenous contrast.  CONTRAST: 100mL OMNIPAQUE IOHEXOL 300 MG/ML SOLN  COMPARISON: CT of the abdomen and pelvis Nov 09, 2008  FINDINGS: LUNG BASES: Included view of the lung bases are clear. Visualized heart and pericardium are unremarkable.  SOLID ORGANS: The spleen, pancreas and adrenal glands are unremarkable. Trace intrahepatic biliary dilatation, similar and postprocedural. The liver is otherwise unremarkable. Status postcholecystectomy.  GASTROINTESTINAL TRACT: Lap band in place. Contiguous catheter. The stomach, small and large  bowel are normal in course and caliber without inflammatory changes. Mild colonic diverticulosis. Normal appendix.  KIDNEYS/ URINARY TRACT: Kidneys are orthotopic, demonstrating symmetric enhancement. No nephrolithiasis, hydronephrosis or solid renal masses. The unopacified ureters are normal in course and caliber. Delayed imaging through the kidneys demonstrates symmetric prompt contrast excretion within the proximal urinary collecting system. Urinary bladder is partially distended and  unremarkable.  PERITONEUM/RETROPERITONEUM: No intraperitoneal free fluid nor free air. Aortoiliac vessels are normal in course and caliber. No lymphadenopathy by CT size criteria. Status post hysterectomy.  SOFT TISSUE/OSSEOUS STRUCTURES: Nonsuspicious.  IMPRESSION: No acute intra-abdominal or pelvic process; normal appendix.  Status post cholecystectomy and hysterectomy.   08/30/2014 CLINICAL DATA: Per Patient: Reports she is having RLQ pain. Reports pain started yesterday and became worse yesterday evening prior to going to bed. Pt endorses nausea denies vomiting and diarrhea. Ax4, NAD.  EXAM: CT ABDOMEN AND PELVIS WITH CONTRAST  TECHNIQUE: Multidetector CT imaging of the abdomen and pelvis was performed using the standard protocol following bolus administration of intravenous contrast.  CONTRAST: OMNIPAQUE IOHEXOL 300 MG/ML SOLN  COMPARISON: 07/07/2014 and 11/09/2008  FINDINGS: Lung bases are normal.  Abdominal images demonstrate lap band apparatus over gastroesophageal junction with associated subcutaneous port over the left anterior mid abdominal wall. There is evidence of a previous cholecystectomy. Minimal air over the distal common bile duct in the region of the ampulla unchanged.  The liver, spleen, pancreas and adrenal glands are otherwise within normal. Kidneys are normal in size without evidence of hydronephrosis. There are a few punctate calcifications over the mid to upper pole corticomedullary junction of the left kidney. Appendix is normal. Vascular structures are unremarkable. Mesentery is within normal. Large and small bowel are within normal.  Pelvic images demonstrate surgical absence of the uterus. Left ovary is not visualized. Right ovary demonstrates a simple appearing 4 cm cyst. There are several phleboliths. The bladder is decompressed. Rectum is normal. Remaining bones and soft tissues are otherwise within  normal.  IMPRESSION: No acute findings in the abdomen/pelvis.  Few punctate calcifications over the corticomedullary junction of the left renal midpole likely tiny stones.  4 cm simple appearing right ovarian cyst.  Stable postsurgical changes as described including stable lap band apparatus.     Assessment:     Right ov cyst- not sure if this is the etiology of her pain as the pain seems to have preceeded the cyst on the CT.     Plan:     Pelvic sono- TV and complete Ultram  q 4-6 hour prn pain #60 Naproxen  bid prn F/u in 3 months or sooner prn.

## 2014-09-08 ENCOUNTER — Ambulatory Visit (HOSPITAL_COMMUNITY)
Admission: RE | Admit: 2014-09-08 | Discharge: 2014-09-08 | Disposition: A | Discharge status: Home / Self Care | Payer: Medicaid Other | Source: Ambulatory Visit | Attending: Obstetrics & Gynecology | Admitting: Obstetrics & Gynecology

## 2014-09-08 DIAGNOSIS — Z90710 Acquired absence of both cervix and uterus: Secondary | ICD-10-CM | POA: Insufficient documentation

## 2014-09-08 DIAGNOSIS — R1031 Right lower quadrant pain: Secondary | ICD-10-CM | POA: Diagnosis present

## 2014-09-08 DIAGNOSIS — N8320 Unspecified ovarian cysts: Secondary | ICD-10-CM | POA: Insufficient documentation

## 2014-09-08 DIAGNOSIS — N83201 Unspecified ovarian cyst, right side: Secondary | ICD-10-CM

## 2014-09-13 ENCOUNTER — Telehealth: Payer: Self-pay | Admitting: General Practice

## 2014-09-13 DIAGNOSIS — N83201 Unspecified ovarian cyst, right side: Secondary | ICD-10-CM

## 2014-09-13 NOTE — Telephone Encounter (Signed)
Patient called and left message requesting ultrasound results. Called patient back and informed her of results and follow up ultrasound needed in 12 weeks. Patient verbalized understanding and requests morning appt. Ultrasound scheduled for 11/29/14 @915am . Informed patient of appt. Patient verbalized understanding and had no questions

## 2014-09-13 NOTE — Telephone Encounter (Signed)
-----   Message from Willodean Rosenthalarolyn Harraway-Smith, MD sent at 09/08/2014  5:11 PM EDT ----- Please call pt.  Please notify her that she is a simple cyst on her right ovary that is consistent with what was on her CT.    She needs a repeat sono in 12 weeks to check for resolution.  Thx, clh-S

## 2014-11-14 ENCOUNTER — Emergency Department (HOSPITAL_COMMUNITY)
Admission: EM | Admit: 2014-11-14 | Discharge: 2014-11-14 | Disposition: A | Discharge status: Home / Self Care | Payer: Medicaid Other | Attending: Emergency Medicine | Admitting: Emergency Medicine

## 2014-11-14 ENCOUNTER — Emergency Department (HOSPITAL_COMMUNITY): Payer: Medicaid Other

## 2014-11-14 ENCOUNTER — Encounter (HOSPITAL_COMMUNITY): Payer: Self-pay | Admitting: *Deleted

## 2014-11-14 DIAGNOSIS — Y999 Unspecified external cause status: Secondary | ICD-10-CM | POA: Diagnosis not present

## 2014-11-14 DIAGNOSIS — S3992XA Unspecified injury of lower back, initial encounter: Secondary | ICD-10-CM | POA: Diagnosis present

## 2014-11-14 DIAGNOSIS — Z791 Long term (current) use of non-steroidal anti-inflammatories (NSAID): Secondary | ICD-10-CM | POA: Insufficient documentation

## 2014-11-14 DIAGNOSIS — Y9389 Activity, other specified: Secondary | ICD-10-CM | POA: Insufficient documentation

## 2014-11-14 DIAGNOSIS — S199XXA Unspecified injury of neck, initial encounter: Secondary | ICD-10-CM | POA: Insufficient documentation

## 2014-11-14 DIAGNOSIS — S39012A Strain of muscle, fascia and tendon of lower back, initial encounter: Secondary | ICD-10-CM | POA: Insufficient documentation

## 2014-11-14 DIAGNOSIS — J45909 Unspecified asthma, uncomplicated: Secondary | ICD-10-CM | POA: Insufficient documentation

## 2014-11-14 DIAGNOSIS — Z8659 Personal history of other mental and behavioral disorders: Secondary | ICD-10-CM | POA: Diagnosis not present

## 2014-11-14 DIAGNOSIS — Y92410 Unspecified street and highway as the place of occurrence of the external cause: Secondary | ICD-10-CM | POA: Insufficient documentation

## 2014-11-14 MED ORDER — KETOROLAC TROMETHAMINE 60 MG/2ML IM SOLN
60.0000 mg | Freq: Once | INTRAMUSCULAR | Status: AC
  Administered 2014-11-14: 60 mg via INTRAMUSCULAR
  Filled 2014-11-14: qty 2

## 2014-11-14 MED ORDER — OXYCODONE-ACETAMINOPHEN 5-325 MG PO TABS
1.0000 | ORAL_TABLET | Freq: Once | ORAL | Status: AC
  Administered 2014-11-14: 1 via ORAL
  Filled 2014-11-14: qty 1

## 2014-11-14 MED ORDER — IBUPROFEN 800 MG PO TABS: 800.0000 mg | ORAL_TABLET | Freq: Three times a day (TID) | ORAL | Status: DC | PRN

## 2014-11-14 MED ORDER — HYDROCODONE-ACETAMINOPHEN 5-325 MG PO TABS: 1.0000 | ORAL_TABLET | Freq: Four times a day (QID) | ORAL | Status: DC | PRN

## 2014-11-14 MED ORDER — CYCLOBENZAPRINE HCL 10 MG PO TABS: 10.0000 mg | ORAL_TABLET | Freq: Three times a day (TID) | ORAL | Status: DC | PRN
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
## 2014-11-14 NOTE — ED Notes (Signed)
Pt stable, ambulatory, states understanding of discharge instructions 
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
## 2014-11-14 NOTE — Discharge Instructions (Signed)
Return here as needed.  Follow-up with your primary care doctor.  Your x-rays did not show any abnormalities.  Use ice and heat on your lower back

## 2014-11-29 ENCOUNTER — Ambulatory Visit (HOSPITAL_COMMUNITY)
Admission: RE | Admit: 2014-11-29 | Discharge: 2014-11-29 | Disposition: A | Discharge status: Home / Self Care | Payer: Medicaid Other | Source: Ambulatory Visit | Attending: Obstetrics & Gynecology | Admitting: Obstetrics & Gynecology

## 2014-11-29 ENCOUNTER — Telehealth: Payer: Self-pay

## 2014-11-29 DIAGNOSIS — N8320 Unspecified ovarian cysts: Secondary | ICD-10-CM | POA: Diagnosis not present

## 2014-11-29 DIAGNOSIS — N83201 Unspecified ovarian cyst, right side: Secondary | ICD-10-CM

## 2014-11-29 DIAGNOSIS — Z90710 Acquired absence of both cervix and uterus: Secondary | ICD-10-CM | POA: Insufficient documentation

## 2014-11-29 NOTE — Telephone Encounter (Signed)
Called patient and informed her of results of resolved cyst. Patient verbalized understanding and gratitude. No questions or concerns.

## 2014-11-29 NOTE — Telephone Encounter (Signed)
-----   Message from Tereso Newcomer, MD sent at 11/29/2014  3:00 PM EDT ----- Resolved ovarian cyst. Please call to inform patient of results.

## 2015-02-22 ENCOUNTER — Encounter (HOSPITAL_COMMUNITY): Payer: Self-pay | Admitting: *Deleted

## 2015-02-22 ENCOUNTER — Emergency Department (HOSPITAL_COMMUNITY)
Admission: EM | Admit: 2015-02-22 | Discharge: 2015-02-22 | Disposition: A | Discharge status: Home / Self Care | Payer: Medicaid Other | Attending: Emergency Medicine | Admitting: Emergency Medicine

## 2015-02-22 ENCOUNTER — Emergency Department (HOSPITAL_COMMUNITY): Payer: Medicaid Other

## 2015-02-22 DIAGNOSIS — Y92007 Garden or yard of unspecified non-institutional (private) residence as the place of occurrence of the external cause: Secondary | ICD-10-CM | POA: Insufficient documentation

## 2015-02-22 DIAGNOSIS — M25561 Pain in right knee: Secondary | ICD-10-CM

## 2015-02-22 DIAGNOSIS — F329 Major depressive disorder, single episode, unspecified: Secondary | ICD-10-CM | POA: Insufficient documentation

## 2015-02-22 DIAGNOSIS — Y998 Other external cause status: Secondary | ICD-10-CM | POA: Insufficient documentation

## 2015-02-22 DIAGNOSIS — W01198A Fall on same level from slipping, tripping and stumbling with subsequent striking against other object, initial encounter: Secondary | ICD-10-CM | POA: Insufficient documentation

## 2015-02-22 DIAGNOSIS — S8991XA Unspecified injury of right lower leg, initial encounter: Secondary | ICD-10-CM | POA: Insufficient documentation

## 2015-02-22 DIAGNOSIS — Y9389 Activity, other specified: Secondary | ICD-10-CM | POA: Diagnosis not present

## 2015-02-22 DIAGNOSIS — J45901 Unspecified asthma with (acute) exacerbation: Secondary | ICD-10-CM | POA: Insufficient documentation

## 2015-02-22 MED ORDER — FENTANYL CITRATE (PF) 100 MCG/2ML IJ SOLN
100.0000 ug | Freq: Once | INTRAMUSCULAR | Status: AC
  Administered 2015-02-22: 100 ug via INTRAVENOUS
  Filled 2015-02-22: qty 2

## 2015-02-22 MED ORDER — HYDROCODONE-ACETAMINOPHEN 5-325 MG PO TABS: 2.0000 | ORAL_TABLET | ORAL | Status: DC | PRN

## 2015-02-22 NOTE — ED Provider Notes (Signed)
CSN: [id number]     Arrival date & time 02/22/15  2105 History   First MD Initiated Contact with Patient 02/22/15 2134     Chief Complaint  Patient presents with  . Knee Injury     (Consider location/radiation/quality/duration/timing/severity/associated sxs/prior Treatment) HPI Melissa Dodson is a 44 y.o. female comes in for evaluation of knee injury. Patient states she was working outside in her garden earlier this afternoon when she tripped and landed on her stone stairs on her right knee. Patient reports immobility secondary to pain. She denies any numbness or weakness or open wounds. No head trauma or LOC. Rates discomfort as 5/10. Extremity was immobilized by EMS prior to arrival. Reports her orthopedist is Dr. Luiz Blare and she will be able to follow-up with him tomorrow.  Past Medical History  Diagnosis Date  . Depression   . Asthma with exacerbation 09/01/2014   Past Surgical History  Procedure Laterality Date  . Knee surgery    . Abdominal hysterectomy    . Laparoscopic gastric banding N/A 2013    performed in [place]  . Cholecystectomy N/A 01/28/2014    Procedure: LAPAROSCOPIC CHOLECYSTECTOMY WITH INTRAOPERATIVE CHOLANGIOGRAM;  Surgeon: Valarie Merino, MD;  Location: [facility];  Service: General;  Laterality: N/A;   Family History  Problem Relation Age of Onset  . Adopted: Yes  . Family history unknown: Yes   Social History  Substance Use Topics  . Smoking status: Never Smoker   . Smokeless tobacco: Never Used  . Alcohol Use: Yes     Comment: social on occ   OB History    Gravida Para Term Preterm AB TAB SAB Ectopic Multiple Living   Review of Systems A 10 point review of systems was completed and was negative except for pertinent positives and negatives as mentioned in the history of present illness     Allergies  Sulfa antibiotics  Home Medications   Prior to Admission medications   Medication Sig Start Date End Date Taking?  Authorizing Provider  ibuprofen (ADVIL,MOTRIN) 200 MG tablet Take 400 mg by mouth every 6 (six) hours as needed for headache.   Yes Historical Provider, MD  cyclobenzaprine (FLEXERIL) 10 MG tablet Take 1 tablet (10 mg total) by mouth 3 (three) times daily as needed for muscle spasms. Patient not taking: Reported on 02/22/2015 11/14/14   Charlestine Night, PA-C  HYDROcodone-acetaminophen (NORCO) 5-325 MG per tablet Take 2 tablets by mouth every 4 (four) hours as needed. 02/22/15   Joycie Peek, PA-C  ibuprofen (ADVIL,MOTRIN) 800 MG tablet Take 1 tablet (800 mg total) by mouth every 8 (eight) hours as needed. Patient not taking: Reported on 02/22/2015 11/14/14   Charlestine Night, PA-C  naproxen (NAPROSYN) 500 MG tablet Take 1 tablet (500 mg total) by mouth 2 (two) times daily with a meal. Patient not taking: Reported on 02/22/2015 09/05/14   Willodean Rosenthal, MD  promethazine (PHENERGAN) 25 MG tablet Take 1 tablet (25 mg total) by mouth every 6 (six) hours as needed for nausea or vomiting. Patient not taking: Reported on 09/05/2014 08/30/14   Junius Finner, PA-C  traMADol (ULTRAM) 50 MG tablet Take 1 tablet (50 mg total) by mouth every 6 (six) hours as needed. Patient not taking: Reported on 02/22/2015 09/05/14   Willodean Rosenthal, MD   BP 136/73 mmHg  Pulse 78  Temp(Src) 98.4 F (36.9 C) (Oral)  Resp 18  SpO2 98% Physical Exam  Constitutional: She is oriented to person, place, and time. She appears well-developed and well-nourished.  HENT:  Head: Normocephalic and atraumatic.  Mouth/Throat: Oropharynx is clear and moist.  Eyes: Conjunctivae are normal. Pupils are equal, round, and reactive to light. Right eye exhibits no discharge. Left eye exhibits no discharge. No scleral icterus.  Neck: Neck supple.  Cardiovascular: Normal rate, regular rhythm and normal heart sounds.   Pulmonary/Chest: Effort normal and breath sounds normal. No respiratory distress. She has no wheezes. She has no  rales.  Abdominal: Soft. There is no tenderness.  Musculoskeletal: She exhibits no tenderness.  Tenderness diffusely throughout right knee. No focal tenderness. Range of motion is limited secondary to pain. No obvious deformities noted. Distal pulses intact.  Neurological: She is alert and oriented to person, place, and time.  Cranial Nerves II-XII grossly intact  Skin: Skin is warm and dry. No rash noted.  Psychiatric: She has a normal mood and affect.  Nursing note and vitals reviewed.   ED Course  Procedures (including critical care time) Labs Review Labs Reviewed - No data to display  Imaging Review Dg Knee Complete 4 Views Right  02/22/2015   CLINICAL DATA:  Trip and fall injury. Pain in the patellar area rotating to the posterior knee.  EXAM: RIGHT KNEE - COMPLETE 4+ VIEW  COMPARISON:  None.  FINDINGS: There is no evidence of fracture, dislocation, or joint effusion. There is no evidence of arthropathy or other focal bone abnormality. Soft tissues are unremarkable.  IMPRESSION: Negative.   Electronically Signed   By: Burman Nieves M.D.   On: 02/22/2015 22:23   I have personally reviewed and evaluated these images and lab results as part of my medical decision-making.   EKG Interpretation None     Meds given in ED:  Medications  fentaNYL (SUBLIMAZE) injection 100 mcg (100 mcg Intravenous Given 02/22/15 2226)    Discharge Medication List as of 02/22/2015 11:11 PM     Filed Vitals:   02/22/15 2116 02/22/15 2317  BP: 133/77 136/73  Pulse: 77 78  Temp: 98.4 F (36.9 C)   TempSrc: Oral   Resp: 16 18  SpO2: 95% 98%    MDM  Vitals stable - WNL -afebrile Pt resting comfortably in ED. PE--patient does maintain some active range of motion of right knee she does maintain diffuse tenderness to her knee. No erythema or gross edema noted. Imaging--plain films of right knee are negative for any acute osseous abnormalities or effusions.  Patient presents with right knee pain  after direct trauma to right knee. X-rays are negative, normal neuro exam. Low suspicion for tibial plateau fracture. Possibility of soft tissue injury. Patient will be able to follow-up with her orthopedist tomorrow for reevaluation. Given short course pain medicines, crutches and knee immobilizer.  I discussed all relevant lab findings and imaging results with pt and they verbalized understanding. Discussed f/u with PCP within 48 hrs and return precautions, pt very amenable to plan. Prior to patient discharge, I discussed and reviewed this case with Dr. Dalene Seltzer   Final diagnoses:  Knee pain, acute, right        Joycie Peek, PA-C 02/23/15 0010  Alvira Monday, MD 02/23/15 1610

## 2015-02-22 NOTE — Progress Notes (Signed)
Patient listed as having Medicaid [employer] insurance without a pcp.  PCP listed on patient's insurance card is located at [facility] [phone].  System updated. 

## 2015-02-22 NOTE — ED Notes (Signed)
Per EMS report, pt slipped and tripped outside (witnessed), hitting her right knee. Hx of bilateral knee surgery in the past. Right knee appears to be dislocated, extremity immobilized prior to arrival. No LOC, head or back pain. IV access and 250 Fentanyl given pta, vss.

## 2015-02-22 NOTE — Discharge Instructions (Signed)
It is important to follow-up with your orthopedist tomorrow for further evaluation and management of your symptoms. Your x-rays in the ED were negative for any bones or dislocations. Please take your pain medicines as prescribed. Do not drive or operate machinery while taking this medicine. Return to ED for worsening symptoms.  Knee Pain The knee is the complex joint between your thigh and your lower leg. It is made up of bones, tendons, ligaments, and cartilage. The bones that make up the knee are:  The femur in the thigh.  The tibia and fibula in the lower leg.  The patella or kneecap riding in the groove on the lower femur. CAUSES  Knee pain is a common complaint with many causes. A few of these causes are:  Injury, such as:  A ruptured ligament or tendon injury.  Torn cartilage.  Medical conditions, such as:  Gout  Arthritis  Infections  Overuse, over training, or overdoing a physical activity. Knee pain can be minor or severe. Knee pain can accompany debilitating injury. Minor knee problems often respond well to self-care measures or get well on their own. More serious injuries may need medical intervention or even surgery. SYMPTOMS The knee is complex. Symptoms of knee problems can vary widely. Some of the problems are:  Pain with movement and weight bearing.  Swelling and tenderness.  Buckling of the knee.  Inability to straighten or extend your knee.  Your knee locks and you cannot straighten it.  Warmth and redness with pain and fever.  Deformity or dislocation of the kneecap. DIAGNOSIS  Determining what is wrong may be very straight forward such as when there is an injury. It can also be challenging because of the complexity of the knee. Tests to make a diagnosis may include:  Your caregiver taking a history and doing a physical exam.  Routine X-rays can be used to rule out other problems. X-rays will not reveal a cartilage tear. Some injuries of the knee  can be diagnosed by:  Arthroscopy a surgical technique by which a small video camera is inserted through tiny incisions on the sides of the knee. This procedure is used to examine and repair internal knee joint problems. Tiny instruments can be used during arthroscopy to repair the torn knee cartilage (meniscus).  Arthrography is a radiology technique. A contrast liquid is directly injected into the knee joint. Internal structures of the knee joint then become visible on X-ray film.  An MRI scan is a non X-ray radiology procedure in which magnetic fields and a computer produce two- or three-dimensional images of the inside of the knee. Cartilage tears are often visible using an MRI scanner. MRI scans have largely replaced arthrography in diagnosing cartilage tears of the knee.  Blood work.  Examination of the fluid that helps to lubricate the knee joint (synovial fluid). This is done by taking a sample out using a needle and a syringe. TREATMENT The treatment of knee problems depends on the cause. Some of these treatments are:  Depending on the injury, proper casting, splinting, surgery, or physical therapy care will be needed.  Give yourself adequate recovery time. Do not overuse your joints. If you begin to get sore during workout routines, back off. Slow down or do fewer repetitions.  For repetitive activities such as cycling or running, maintain your strength and nutrition.  Alternate muscle groups. For example, if you are a weight lifter, work the upper body on one day and the lower body the next.  Either tight or weak muscles do not give the proper support for your knee. Tight or weak muscles do not absorb the stress placed on the knee joint. Keep the muscles surrounding the knee strong.  Take care of mechanical problems.  If you have flat feet, orthotics or special shoes may help. See your caregiver if you need help.  Arch supports, sometimes with wedges on the inner or outer  aspect of the heel, can help. These can shift pressure away from the side of the knee most bothered by osteoarthritis.  A brace called an "unloader" brace also may be used to help ease the pressure on the most arthritic side of the knee.  If your caregiver has prescribed crutches, braces, wraps or ice, use as directed. The acronym for this is PRICE. This means protection, rest, ice, compression, and elevation.  Nonsteroidal anti-inflammatory drugs (NSAIDs), can help relieve pain. But if taken immediately after an injury, they may actually increase swelling. Take NSAIDs with food in your stomach. Stop them if you develop stomach problems. Do not take these if you have a history of ulcers, stomach pain, or bleeding from the bowel. Do not take without your caregiver's approval if you have problems with fluid retention, heart failure, or kidney problems.  For ongoing knee problems, physical therapy may be helpful.  Glucosamine and chondroitin are over-the-counter dietary supplements. Both may help relieve the pain of osteoarthritis in the knee. These medicines are different from the usual anti-inflammatory drugs. Glucosamine may decrease the rate of cartilage destruction.  Injections of a corticosteroid drug into your knee joint may help reduce the symptoms of an arthritis flare-up. They may provide pain relief that lasts a few months. You may have to wait a few months between injections. The injections do have a small increased risk of infection, water retention, and elevated blood sugar levels.  Hyaluronic acid injected into damaged joints may ease pain and provide lubrication. These injections may work by reducing inflammation. A series of shots may give relief for as long as 6 months.  Topical painkillers. Applying certain ointments to your skin may help relieve the pain and stiffness of osteoarthritis. Ask your pharmacist for suggestions. Many over the-counter products are approved for temporary  relief of arthritis pain.  In some countries, doctors often prescribe topical NSAIDs for relief of chronic conditions such as arthritis and tendinitis. A review of treatment with NSAID creams found that they worked as well as oral medications but without the serious side effects. PREVENTION  Maintain a healthy weight. Extra pounds put more strain on your joints.  Get strong, stay limber. Weak muscles are a common cause of knee injuries. Stretching is important. Include flexibility exercises in your workouts.  Be smart about exercise. If you have osteoarthritis, chronic knee pain or recurring injuries, you may need to change the way you exercise. This does not mean you have to stop being active. If your knees ache after jogging or playing basketball, consider switching to swimming, water aerobics, or other low-impact activities, at least for a few days a week. Sometimes limiting high-impact activities will provide relief.  Make sure your shoes fit well. Choose footwear that is right for your sport.  Protect your knees. Use the proper gear for knee-sensitive activities. Use kneepads when playing volleyball or laying carpet. Buckle your seat belt every time you drive. Most shattered kneecaps occur in car accidents.  Rest when you are tired. SEEK MEDICAL CARE IF:  You have knee pain that is  continual and does not seem to be getting better.  SEEK IMMEDIATE MEDICAL CARE IF:  Your knee joint feels hot to the touch and you have a high fever. MAKE SURE YOU:   Understand these instructions.  Will watch your condition.  Will get help right away if you are not doing well or get worse. Document Released: 03/31/2007 Document Revised: 08/26/2011 Document Reviewed: 03/31/2007 [facility] Patient Information 2015 [place], [place]. This information is not intended to replace advice given to you by your health care provider. Make sure you discuss any questions you have with your health care provider.

## 2015-02-22 NOTE — ED Notes (Signed)
Bed: [facility] Expected date:  Expected time:  Means of arrival:  Comments: fall 

## 2015-02-22 NOTE — ED Notes (Signed)
PA at bedside.

## 2015-04-10 ENCOUNTER — Emergency Department (HOSPITAL_COMMUNITY)
Admission: EM | Admit: 2015-04-10 | Discharge: 2015-04-10 | Disposition: A | Discharge status: Home / Self Care | Payer: Medicaid Other | Attending: Emergency Medicine | Admitting: Emergency Medicine

## 2015-04-10 ENCOUNTER — Encounter (HOSPITAL_COMMUNITY): Payer: Self-pay | Admitting: Cardiology

## 2015-04-10 DIAGNOSIS — T7840XA Allergy, unspecified, initial encounter: Secondary | ICD-10-CM | POA: Diagnosis present

## 2015-04-10 DIAGNOSIS — X58XXXA Exposure to other specified factors, initial encounter: Secondary | ICD-10-CM | POA: Insufficient documentation

## 2015-04-10 DIAGNOSIS — T782XXA Anaphylactic shock, unspecified, initial encounter: Secondary | ICD-10-CM | POA: Diagnosis not present

## 2015-04-10 DIAGNOSIS — J45901 Unspecified asthma with (acute) exacerbation: Secondary | ICD-10-CM | POA: Insufficient documentation

## 2015-04-10 DIAGNOSIS — Y9389 Activity, other specified: Secondary | ICD-10-CM | POA: Insufficient documentation

## 2015-04-10 DIAGNOSIS — F329 Major depressive disorder, single episode, unspecified: Secondary | ICD-10-CM | POA: Insufficient documentation

## 2015-04-10 DIAGNOSIS — Y9289 Other specified places as the place of occurrence of the external cause: Secondary | ICD-10-CM | POA: Insufficient documentation

## 2015-04-10 DIAGNOSIS — Y998 Other external cause status: Secondary | ICD-10-CM | POA: Diagnosis not present

## 2015-04-10 LAB — CBC WITH DIFFERENTIAL/PLATELET
BASOS ABS: 0 10*3/uL (ref 0.0–0.1)
Basophils Relative: 0 %
Eosinophils Absolute: 0.1 10*3/uL (ref 0.0–0.7)
Eosinophils Relative: 1 %
HEMATOCRIT: 45.5 % (ref 36.0–46.0)
Hemoglobin: 15.5 g/dL — ABNORMAL HIGH (ref 12.0–15.0)
LYMPHS ABS: 4 10*3/uL (ref 0.7–4.0)
LYMPHS PCT: 40 %
MCH: 30.6 pg (ref 26.0–34.0)
MCHC: 34.1 g/dL (ref 30.0–36.0)
MCV: 89.7 fL (ref 78.0–100.0)
Monocytes Absolute: 0.7 10*3/uL (ref 0.1–1.0)
Monocytes Relative: 7 %
NEUTROS ABS: 5.2 10*3/uL (ref 1.7–7.7)
Neutrophils Relative %: 52 %
PLATELETS: 376 10*3/uL (ref 150–400)
RBC: 5.07 MIL/uL (ref 3.87–5.11)
RDW: 12.9 % (ref 11.5–15.5)
WBC: 10 10*3/uL (ref 4.0–10.5)

## 2015-04-10 LAB — BASIC METABOLIC PANEL
ANION GAP: 8 (ref 5–15)
BUN: 7 mg/dL (ref 6–20)
CO2: 24 mmol/L (ref 22–32)
Calcium: 8.7 mg/dL — ABNORMAL LOW (ref 8.9–10.3)
Chloride: 107 mmol/L (ref 101–111)
Creatinine, Ser: 0.61 mg/dL (ref 0.44–1.00)
GFR calc Af Amer: >60 mL/min (ref >60)
GFR calc non Af Amer: >60 mL/min (ref >60)
GLUCOSE: 99 mg/dL (ref 65–99)
POTASSIUM: 4.1 mmol/L (ref 3.5–5.1)
SODIUM: 139 mmol/L (ref 135–145)

## 2015-04-10 MED ORDER — SODIUM CHLORIDE 0.9 % IV BOLUS (SEPSIS)
500.0000 mL | Freq: Once | INTRAVENOUS | Status: AC
  Administered 2015-04-10: 500 mL via INTRAVENOUS

## 2015-04-10 MED ORDER — EPINEPHRINE 0.3 MG/0.3ML IJ SOAJ
INTRAMUSCULAR | Status: AC
  Filled 2015-04-10: qty 0.3

## 2015-04-10 MED ORDER — EPINEPHRINE 0.3 MG/0.3ML IJ SOAJ: 0.3000 mg | Freq: Once | INTRAMUSCULAR | Status: DC

## 2015-04-10 MED ORDER — PREDNISONE 10 MG PO TABS: 40.0000 mg | ORAL_TABLET | Freq: Every day | ORAL | Status: DC

## 2015-04-10 MED ORDER — DIPHENHYDRAMINE HCL 50 MG/ML IJ SOLN
25.0000 mg | Freq: Once | INTRAMUSCULAR | Status: AC
  Administered 2015-04-10: 25 mg via INTRAVENOUS
  Filled 2015-04-10: qty 1

## 2015-04-10 MED ORDER — EPINEPHRINE 0.3 MG/0.3ML IJ SOAJ
0.3000 mg | Freq: Once | INTRAMUSCULAR | Status: AC
  Administered 2015-04-10: 0.3 mg via INTRAMUSCULAR

## 2015-04-10 MED ORDER — METHYLPREDNISOLONE SODIUM SUCC 125 MG IJ SOLR
125.0000 mg | Freq: Once | INTRAMUSCULAR | Status: AC
  Administered 2015-04-10: 125 mg via INTRAVENOUS
  Filled 2015-04-10: qty 2

## 2015-04-10 MED ORDER — FAMOTIDINE IN NACL 20-0.9 MG/50ML-% IV SOLN
20.0000 mg | Freq: Once | INTRAVENOUS | Status: AC
  Administered 2015-04-10: 20 mg via INTRAVENOUS
  Filled 2015-04-10: qty 50

## 2015-04-10 MED ORDER — FAMOTIDINE 20 MG PO TABS: 20.0000 mg | ORAL_TABLET | Freq: Two times a day (BID) | ORAL | Status: DC

## 2015-04-10 NOTE — ED Provider Notes (Signed)
CSN: [id number]     Arrival date & time 04/10/15  0840 History   First MD Initiated Contact with Patient 04/10/15 [phone]     Chief Complaint  Patient presents with  . Allergic Reaction     Patient is a 44 y.o. female presenting with allergic reaction. The history is provided by the patient. No language interpreter was used.  Allergic Reaction  Melissa Dodson presents for evaluation of allergic reaction. She is to facial cream on Saturday, 2 days ago and developed itching and swelling to her face, tongue and throat at that time. She was seen in urgent care and given an EpiPen and another medication and discharged home. Her symptoms improved until last night when she developed itching diffusely and this morning around 8:30 she awoke with throat tightness and swelling. She was not discharged home with any medications from urgent care is not currently taking any steroids. She has not started the facial cream again. Symptoms are severe, constant, worsening.  Past Medical History  Diagnosis Date  . Depression   . Asthma with exacerbation 09/01/2014   Past Surgical History  Procedure Laterality Date  . Knee surgery    . Abdominal hysterectomy    . Laparoscopic gastric banding N/A 2013    performed in [place]  . Cholecystectomy N/A 01/28/2014    Procedure: LAPAROSCOPIC CHOLECYSTECTOMY WITH INTRAOPERATIVE CHOLANGIOGRAM;  Surgeon: Valarie Merino, MD;  Location: [facility];  Service: General;  Laterality: N/A;   Family History  Problem Relation Age of Onset  . Adopted: Yes  . Family history unknown: Yes   Social History  Substance Use Topics  . Smoking status: Never Smoker   . Smokeless tobacco: Never Used  . Alcohol Use: Yes     Comment: social on occ   OB History    Gravida Para Term Preterm AB TAB SAB Ectopic Multiple Living   Review of Systems  All other systems reviewed and are negative.     Allergies  Sulfa antibiotics  Home Medications   Prior to  Admission medications   Medication Sig Start Date End Date Taking? Authorizing Provider  cyclobenzaprine (FLEXERIL) 10 MG tablet Take 1 tablet (10 mg total) by mouth 3 (three) times daily as needed for muscle spasms. Patient not taking: Reported on 02/22/2015 11/14/14   Charlestine Night, PA-C  HYDROcodone-acetaminophen (NORCO) 5-325 MG per tablet Take 2 tablets by mouth every 4 (four) hours as needed. 02/22/15   Joycie Peek, PA-C  ibuprofen (ADVIL,MOTRIN) 200 MG tablet Take 400 mg by mouth every 6 (six) hours as needed for headache.    Historical Provider, MD  ibuprofen (ADVIL,MOTRIN) 800 MG tablet Take 1 tablet (800 mg total) by mouth every 8 (eight) hours as needed. Patient not taking: Reported on 02/22/2015 11/14/14   Charlestine Night, PA-C  naproxen (NAPROSYN) 500 MG tablet Take 1 tablet (500 mg total) by mouth 2 (two) times daily with a meal. Patient not taking: Reported on 02/22/2015 09/05/14   Willodean Rosenthal, MD  promethazine (PHENERGAN) 25 MG tablet Take 1 tablet (25 mg total) by mouth every 6 (six) hours as needed for nausea or vomiting. Patient not taking: Reported on 09/05/2014 08/30/14   Junius Finner, PA-C  traMADol (ULTRAM) 50 MG tablet Take 1 tablet (50 mg total) by mouth every 6 (six) hours as needed. Patient not taking: Reported on 02/22/2015 09/05/14   Willodean Rosenthal, MD   There were no vitals  taken for this visit. Physical Exam  Constitutional: She is oriented to person, place, and time. She appears well-developed and well-nourished. She appears distressed.  HENT:  Head: Normocephalic and atraumatic.  Swelling of right upper lip, mild swelling of the tongue diffusely. There is erythema and mild swelling of the posterior oropharynx.  Cardiovascular: Normal rate and regular rhythm.   No murmur heard. Pulmonary/Chest: Effort normal and breath sounds normal. No respiratory distress.  Abdominal: Soft. There is no tenderness. There is no rebound and no guarding.   Musculoskeletal: She exhibits no edema or tenderness.  Neurological: She is alert and oriented to person, place, and time.  Skin: Skin is warm and dry.  Erythema of the face with scattered urticaria on upper back. There are a few petechiae on the left lower extremity.  Psychiatric: She has a normal mood and affect. Her behavior is normal.  Nursing note and vitals reviewed.   ED Course  Procedures  CRITICAL CARE Performed by: Tilden FossaEES, Remo Kirschenmann   Total critical care time: 30 minutes  Critical care time was exclusive of separately billable procedures and treating other patients.  Critical care was necessary to treat or prevent imminent or life-threatening deterioration.  Critical care was time spent personally by me on the following activities: development of treatment plan with patient and/or surrogate as well as nursing, discussions with consultants, evaluation of patient's response to treatment, examination of patient, obtaining history from patient or surrogate, ordering and performing treatments and interventions, ordering and review of laboratory studies, ordering and review of radiographic studies, pulse oximetry and re-evaluation of patient's condition.  Labs Review Labs Reviewed  BASIC METABOLIC PANEL - Abnormal; Notable for the following:    Calcium 8.7 (*)    All other components within normal limits  CBC WITH DIFFERENTIAL/PLATELET - Abnormal; Notable for the following:    Hemoglobin 15.5 (*)    All other components within normal limits    Imaging Review No results found. I have personally reviewed and evaluated these images and lab results as part of my medical decision-making.   EKG Interpretation None      MDM   Final diagnoses:  Anaphylaxis, initial encounter    Patient here for evaluation and states she will itching, swelling of throat and tongue. Presentation is consistent with anaphylactic reaction. Patient treated with epinephrine, Solu-Medrol, Lotrel,  Pepcid, IV fluids. Her facial swelling improved considerably on multiple repeat evaluations in the emergency department. She did have an episode of recurrent burning sensation in her hands that responded to Benadryl. Patient has not had contact with the known allergen for 2 days. Discussed with patient continuing to avoid offending cream. Discussed home care and return precautions for allergic reaction. Providing prescriptions for EpiPen, Pepcid, prednisone. Discussed over-the-counter Benadryl use for itching.    Tilden FossaElizabeth Abrial Arrighi, MD 04/11/15 0630

## 2015-04-10 NOTE — ED Notes (Signed)
Allergic reaction began to worsen again this morning.  Pt has small red spots on bilateral lower extremities, back and throat.  Pt reports burning all over and extremely hoarse voice.  Voice is already clearing up since given epi pen.

## 2015-04-10 NOTE — Discharge Instructions (Signed)
Use benadryl, available over the counter for itching.   Anaphylactic Reaction An anaphylactic reaction is a sudden, severe allergic reaction that involves the whole body. It can be life threatening. A hospital stay is often required. People with asthma, eczema, or hay fever are slightly more likely to have an anaphylactic reaction. CAUSES  An anaphylactic reaction may be caused by anything to which you are allergic. After being exposed to the allergic substance, your immune system becomes sensitized to it. When you are exposed to that allergic substance again, an allergic reaction can occur. Common causes of an anaphylactic reaction include:  Medicines.  Foods, especially peanuts, wheat, shellfish, milk, and eggs.  Insect bites or stings.  Blood products.  Chemicals, such as dyes, latex, and contrast material used for imaging tests. SYMPTOMS  When an allergic reaction occurs, the body releases histamine and other substances. These substances cause symptoms such as tightening of the airway. Symptoms often develop within seconds or minutes of exposure. Symptoms may include:  Skin rash or hives.  Itching.  Chest tightness.  Swelling of the eyes, tongue, or lips.  Trouble breathing or swallowing.  Lightheadedness or fainting.  Anxiety or confusion.  Stomach pains, vomiting, or diarrhea.  Nasal congestion.  A fast or irregular heartbeat (palpitations). DIAGNOSIS  Diagnosis is based on your history of recent exposure to allergic substances, your symptoms, and a physical exam. Your caregiver may also perform blood or urine tests to confirm the diagnosis. TREATMENT  Epinephrine medicine is the main treatment for an anaphylactic reaction. Other medicines that may be used for treatment include antihistamines, steroids, and albuterol. In severe cases, fluids and medicine to support blood pressure may be given through an intravenous line (IV). Even if you improve after treatment, you need  to be observed to make sure your condition does not get worse. This may require a stay in the hospital. [place] a medical alert bracelet or necklace stating your allergy.  You and your family must learn how to use an anaphylaxis kit or give an epinephrine injection to temporarily treat an emergency allergic reaction. Always carry your epinephrine injection or anaphylaxis kit with you. This can be lifesaving if you have a severe reaction.  Do not drive or perform tasks after treatment until the medicines used to treat your reaction have worn off, or until your caregiver says it is okay.  If you have hives or a rash:  Take medicines as directed by your caregiver.  You may use an over-the-counter antihistamine (diphenhydramine) as needed.  Apply cold compresses to the skin or take baths in cool water. Avoid hot baths or showers. SEEK MEDICAL CARE IF:   You develop symptoms of an allergic reaction to a new substance. Symptoms may start right away or minutes later.  You develop a rash, hives, or itching.  You develop new symptoms. SEEK IMMEDIATE MEDICAL CARE IF:   You have swelling of the mouth, difficulty breathing, or wheezing.  You have a tight feeling in your chest or throat.  You develop hives, swelling, or itching all over your body.  You develop severe vomiting or diarrhea.  You feel faint or pass out. This is an emergency. Use your epinephrine injection or anaphylaxis kit as you have been instructed. Call your local emergency services (911 in [place] Even if you improve after the injection, you need to be examined at a hospital emergency department. MAKE SURE YOU:   Understand these instructions.  Will watch your condition.  Will get help right away if you are not doing well or get worse.   This information is not intended to replace advice given to you by your health care provider. Make sure you discuss any questions you have with your health care  provider.   Document Released: 06/03/2005 Document Revised: 06/08/2013 Document Reviewed: 12/14/2014 Elsevier Interactive Patient Education [employer].

## 2015-04-10 NOTE — ED Notes (Signed)
EDP at the bedside.  ?

## 2015-04-10 NOTE — ED Notes (Signed)
Reports she is having an allergic reaction to a facial cream that she last used on Saturday morning. [facility] and given epi there and steroids. Reports she feels like her throat is tight and face itches.

## 2015-04-12 ENCOUNTER — Inpatient Hospital Stay (HOSPITAL_COMMUNITY)
Admission: EM | Admit: 2015-04-12 | Discharge: 2015-04-16 | DRG: 916 | Disposition: A | Discharge status: Home / Self Care | Payer: Medicaid Other | Attending: Internal Medicine | Admitting: Internal Medicine

## 2015-04-12 ENCOUNTER — Encounter (HOSPITAL_COMMUNITY): Payer: Self-pay | Admitting: *Deleted

## 2015-04-12 DIAGNOSIS — Z79899 Other long term (current) drug therapy: Secondary | ICD-10-CM

## 2015-04-12 DIAGNOSIS — T782XXA Anaphylactic shock, unspecified, initial encounter: Secondary | ICD-10-CM | POA: Diagnosis present

## 2015-04-12 DIAGNOSIS — L508 Other urticaria: Secondary | ICD-10-CM | POA: Diagnosis present

## 2015-04-12 DIAGNOSIS — Z9889 Other specified postprocedural states: Secondary | ICD-10-CM

## 2015-04-12 DIAGNOSIS — T783XXA Angioneurotic edema, initial encounter: Secondary | ICD-10-CM | POA: Diagnosis present

## 2015-04-12 DIAGNOSIS — F329 Major depressive disorder, single episode, unspecified: Secondary | ICD-10-CM | POA: Diagnosis not present

## 2015-04-12 DIAGNOSIS — T7840XD Allergy, unspecified, subsequent encounter: Secondary | ICD-10-CM | POA: Diagnosis not present

## 2015-04-12 DIAGNOSIS — T7840XA Allergy, unspecified, initial encounter: Secondary | ICD-10-CM | POA: Diagnosis present

## 2015-04-12 DIAGNOSIS — Y848 Other medical procedures as the cause of abnormal reaction of the patient, or of later complication, without mention of misadventure at the time of the procedure: Secondary | ICD-10-CM | POA: Diagnosis present

## 2015-04-12 DIAGNOSIS — R590 Localized enlarged lymph nodes: Secondary | ICD-10-CM | POA: Diagnosis present

## 2015-04-12 DIAGNOSIS — L299 Pruritus, unspecified: Secondary | ICD-10-CM | POA: Diagnosis present

## 2015-04-12 DIAGNOSIS — T886XXA Anaphylactic reaction due to adverse effect of correct drug or medicament properly administered, initial encounter: Principal | ICD-10-CM | POA: Diagnosis present

## 2015-04-12 DIAGNOSIS — T782XXS Anaphylactic shock, unspecified, sequela: Secondary | ICD-10-CM

## 2015-04-12 DIAGNOSIS — T490X5A Adverse effect of local antifungal, anti-infective and anti-inflammatory drugs, initial encounter: Secondary | ICD-10-CM | POA: Diagnosis present

## 2015-04-12 DIAGNOSIS — F419 Anxiety disorder, unspecified: Secondary | ICD-10-CM | POA: Diagnosis present

## 2015-04-12 DIAGNOSIS — L509 Urticaria, unspecified: Secondary | ICD-10-CM | POA: Diagnosis present

## 2015-04-12 DIAGNOSIS — T7840XS Allergy, unspecified, sequela: Secondary | ICD-10-CM

## 2015-04-12 DIAGNOSIS — F32A Depression, unspecified: Secondary | ICD-10-CM | POA: Diagnosis present

## 2015-04-12 DIAGNOSIS — J45909 Unspecified asthma, uncomplicated: Secondary | ICD-10-CM | POA: Diagnosis present

## 2015-04-12 LAB — CBC WITH DIFFERENTIAL/PLATELET
BASOS ABS: 0 10*3/uL (ref 0.0–0.1)
BASOS PCT: 0 %
EOS PCT: 0 %
Eosinophils Absolute: 0 10*3/uL (ref 0.0–0.7)
HCT: 39.5 % (ref 36.0–46.0)
Hemoglobin: 12.9 g/dL (ref 12.0–15.0)
LYMPHS PCT: 6 %
Lymphs Abs: 0.7 10*3/uL (ref 0.7–4.0)
MCH: 29.5 pg (ref 26.0–34.0)
MCHC: 32.7 g/dL (ref 30.0–36.0)
MCV: 90.4 fL (ref 78.0–100.0)
MONO ABS: 0.1 10*3/uL (ref 0.1–1.0)
Monocytes Relative: 1 %
NEUTROS ABS: 10.2 10*3/uL — AB (ref 1.7–7.7)
Neutrophils Relative %: 93 %
PLATELETS: 296 10*3/uL (ref 150–400)
RBC: 4.37 MIL/uL (ref 3.87–5.11)
RDW: 12.8 % (ref 11.5–15.5)
WBC: 11 10*3/uL — AB (ref 4.0–10.5)

## 2015-04-12 LAB — BASIC METABOLIC PANEL
ANION GAP: 10 (ref 5–15)
BUN: 12 mg/dL (ref 6–20)
CALCIUM: 7.8 mg/dL — AB (ref 8.9–10.3)
CO2: 21 mmol/L — ABNORMAL LOW (ref 22–32)
Chloride: 111 mmol/L (ref 101–111)
Creatinine, Ser: 0.69 mg/dL (ref 0.44–1.00)
GLUCOSE: 126 mg/dL — AB (ref 65–99)
POTASSIUM: 3.5 mmol/L (ref 3.5–5.1)
Sodium: 142 mmol/L (ref 135–145)

## 2015-04-12 MED ORDER — DIPHENHYDRAMINE HCL 50 MG/ML IJ SOLN
25.0000 mg | Freq: Once | INTRAMUSCULAR | Status: AC
  Administered 2015-04-12: 25 mg via INTRAVENOUS
  Filled 2015-04-12: qty 1

## 2015-04-12 MED ORDER — DIPHENHYDRAMINE HCL 50 MG/ML IJ SOLN
50.0000 mg | Freq: Once | INTRAMUSCULAR | Status: AC
  Administered 2015-04-12: 50 mg via INTRAVENOUS
  Filled 2015-04-12: qty 1

## 2015-04-12 MED ORDER — SODIUM CHLORIDE 0.9 % IV BOLUS (SEPSIS)
1000.0000 mL | Freq: Once | INTRAVENOUS | Status: AC
  Administered 2015-04-12: 1000 mL via INTRAVENOUS

## 2015-04-12 MED ORDER — SODIUM CHLORIDE 0.9 % IJ SOLN: 3.0000 mL | INTRAMUSCULAR | Status: DC | PRN

## 2015-04-12 MED ORDER — DIPHENHYDRAMINE HCL 50 MG/ML IJ SOLN
25.0000 mg | Freq: Four times a day (QID) | INTRAMUSCULAR | Status: DC | PRN
  Administered 2015-04-12 – 2015-04-13 (×2): 25 mg via INTRAVENOUS
  Filled 2015-04-12 (×3): qty 1

## 2015-04-12 MED ORDER — SODIUM CHLORIDE 0.9 % IV SOLN
INTRAVENOUS | Status: DC
  Administered 2015-04-12 – 2015-04-13 (×3): via INTRAVENOUS

## 2015-04-12 MED ORDER — ACETAMINOPHEN 325 MG PO TABS
650.0000 mg | ORAL_TABLET | Freq: Once | ORAL | Status: AC
  Administered 2015-04-12: 650 mg via ORAL
  Filled 2015-04-12: qty 2

## 2015-04-12 MED ORDER — ALBUTEROL SULFATE (2.5 MG/3ML) 0.083% IN NEBU: 2.5000 mg | INHALATION_SOLUTION | RESPIRATORY_TRACT | Status: DC | PRN

## 2015-04-12 MED ORDER — SODIUM CHLORIDE 0.9 % IV SOLN: 250.0000 mL | INTRAVENOUS | Status: DC | PRN

## 2015-04-12 MED ORDER — TRAZODONE HCL 50 MG PO TABS
50.0000 mg | ORAL_TABLET | Freq: Every evening | ORAL | Status: DC | PRN
  Administered 2015-04-12 – 2015-04-13 (×2): 50 mg via ORAL
  Filled 2015-04-12 (×3): qty 1

## 2015-04-12 MED ORDER — TRAMADOL HCL 50 MG PO TABS
50.0000 mg | ORAL_TABLET | Freq: Four times a day (QID) | ORAL | Status: DC | PRN
  Administered 2015-04-12 – 2015-04-14 (×3): 50 mg via ORAL
  Filled 2015-04-12 (×3): qty 1

## 2015-04-12 MED ORDER — EPINEPHRINE 0.3 MG/0.3ML IJ SOAJ
0.3000 mg | INTRAMUSCULAR | Status: DC | PRN
  Administered 2015-04-13 (×2): 0.3 mg via INTRAMUSCULAR
  Filled 2015-04-12 (×5): qty 0.6

## 2015-04-12 MED ORDER — DIPHENHYDRAMINE HCL 50 MG/ML IJ SOLN
INTRAMUSCULAR | Status: AC
  Filled 2015-04-12: qty 1

## 2015-04-12 MED ORDER — ONDANSETRON HCL 4 MG PO TABS
4.0000 mg | ORAL_TABLET | Freq: Four times a day (QID) | ORAL | Status: DC | PRN
Start: 2015-04-12 — End: 2015-04-16

## 2015-04-12 MED ORDER — SODIUM CHLORIDE 0.9 % IJ SOLN
3.0000 mL | Freq: Two times a day (BID) | INTRAMUSCULAR | Status: DC
  Administered 2015-04-13 – 2015-04-14 (×2): 3 mL via INTRAVENOUS

## 2015-04-12 MED ORDER — ALUM & MAG HYDROXIDE-SIMETH 200-200-20 MG/5ML PO SUSP: 30.0000 mL | Freq: Four times a day (QID) | ORAL | Status: DC | PRN

## 2015-04-12 MED ORDER — ACETAMINOPHEN 325 MG PO TABS
650.0000 mg | ORAL_TABLET | Freq: Four times a day (QID) | ORAL | Status: DC | PRN
  Administered 2015-04-12 – 2015-04-16 (×5): 650 mg via ORAL
  Filled 2015-04-12 (×5): qty 2

## 2015-04-12 MED ORDER — SODIUM CHLORIDE 0.9 % IJ SOLN
3.0000 mL | Freq: Two times a day (BID) | INTRAMUSCULAR | Status: DC
  Administered 2015-04-13: 3 mL via INTRAVENOUS

## 2015-04-12 MED ORDER — ENOXAPARIN SODIUM 40 MG/0.4ML ~~LOC~~ SOLN
40.0000 mg | SUBCUTANEOUS | Status: DC
  Administered 2015-04-12 – 2015-04-15 (×4): 40 mg via SUBCUTANEOUS
  Filled 2015-04-12 (×5): qty 0.4

## 2015-04-12 MED ORDER — ONDANSETRON HCL 4 MG/2ML IJ SOLN: 4.0000 mg | Freq: Four times a day (QID) | INTRAMUSCULAR | Status: DC | PRN

## 2015-04-12 MED ORDER — ALBUTEROL SULFATE (2.5 MG/3ML) 0.083% IN NEBU: 5.0000 mg | INHALATION_SOLUTION | RESPIRATORY_TRACT | Status: AC | PRN

## 2015-04-12 MED ORDER — EPINEPHRINE 0.3 MG/0.3ML IJ SOAJ
0.3000 mg | Freq: Once | INTRAMUSCULAR | Status: AC
  Administered 2015-04-12: 0.3 mg via INTRAMUSCULAR
  Filled 2015-04-12: qty 0.3

## 2015-04-12 MED ORDER — ACETAMINOPHEN 650 MG RE SUPP: 650.0000 mg | Freq: Four times a day (QID) | RECTAL | Status: DC | PRN

## 2015-04-12 MED ORDER — METHYLPREDNISOLONE SODIUM SUCC 125 MG IJ SOLR
INTRAMUSCULAR | Status: AC
  Filled 2015-04-12: qty 2

## 2015-04-12 MED ORDER — METHYLPREDNISOLONE SODIUM SUCC 125 MG IJ SOLR
125.0000 mg | Freq: Once | INTRAMUSCULAR | Status: AC
  Administered 2015-04-12: 125 mg via INTRAVENOUS

## 2015-04-12 MED ORDER — ONDANSETRON HCL 4 MG/2ML IJ SOLN
4.0000 mg | Freq: Once | INTRAMUSCULAR | Status: AC
  Administered 2015-04-12: 4 mg via INTRAVENOUS
  Filled 2015-04-12: qty 2

## 2015-04-12 MED ORDER — DIPHENHYDRAMINE HCL 50 MG/ML IJ SOLN
50.0000 mg | Freq: Once | INTRAMUSCULAR | Status: AC
  Administered 2015-04-12: 50 mg via INTRAVENOUS

## 2015-04-12 MED ORDER — EPINEPHRINE 0.3 MG/0.3ML IJ SOAJ
0.3000 mg | INTRAMUSCULAR | Status: DC | PRN
  Administered 2015-04-12: 0.3 mg via INTRAMUSCULAR
  Filled 2015-04-12: qty 0.3

## 2015-04-12 MED ORDER — ALBUTEROL SULFATE (2.5 MG/3ML) 0.083% IN NEBU
5.0000 mg | INHALATION_SOLUTION | Freq: Once | RESPIRATORY_TRACT | Status: AC
  Administered 2015-04-12: 5 mg via RESPIRATORY_TRACT
  Filled 2015-04-12: qty 6

## 2015-04-12 MED ORDER — METHYLPREDNISOLONE SODIUM SUCC 125 MG IJ SOLR
60.0000 mg | Freq: Once | INTRAMUSCULAR | Status: AC
  Administered 2015-04-12: 60 mg via INTRAVENOUS
  Filled 2015-04-12: qty 2

## 2015-04-12 MED ORDER — FAMOTIDINE 20 MG PO TABS
20.0000 mg | ORAL_TABLET | Freq: Two times a day (BID) | ORAL | Status: DC
  Administered 2015-04-12 – 2015-04-16 (×8): 20 mg via ORAL
  Filled 2015-04-12 (×9): qty 1

## 2015-04-12 MED ORDER — PREDNISONE 50 MG PO TABS
50.0000 mg | ORAL_TABLET | Freq: Every day | ORAL | Status: DC
  Administered 2015-04-13 – 2015-04-14 (×2): 50 mg via ORAL
  Filled 2015-04-12 (×3): qty 1

## 2015-04-12 NOTE — ED Notes (Signed)
Pt reports that after eating she is feeling like her throat is progessively getting tight again. Also reports dizziness and headache. ED provider notified. Preparing to give additional dose of epi.

## 2015-04-12 NOTE — Progress Notes (Signed)
While on the phone with ED RN receiving report, patient c/o swelling under her tongue.  The ED RN was going to phone PA to notify them of findings Prior to sending patient to the floor.

## 2015-04-12 NOTE — ED Notes (Signed)
Pt states her face and hands are beginning to itch again, denies any difficulty breathing, airway intact. PA notified. Pt reports this is how the allergic reaction always starts prior to her airway swelling.

## 2015-04-12 NOTE — ED Notes (Signed)
Pt reports allergic reaction to nystatin, was seen here two days ago for same but no relief with prednisone and pepcid. Pt is feel sob and feels like airway is closing. Airway intact at this time.

## 2015-04-12 NOTE — H&P (Signed)
[facility] History and Physical                                                                                    Melissa Dodson, is a 45 y.o. female  MRN: [id number]   DOB - September 27, 1970  Admit Date - 04/12/2015  Outpatient Primary MD for the patient is PROVIDER NOT IN SYSTEM  [facility].  Referring Physician:  Harolyn Rutherford, PA-C  Chief Complaint:   Chief Complaint  Patient presents with  . Allergic Reaction     HPI  Melissa Dodson  is a 44 y.o. female, with asthma and depression who presents to the ER for the second time this week and an anaphylactic reaction.  The patient's only new medication is nystatin. Last week on either October 18 or 19th the patient went to her primary care physician for irritation around her nose. She was prescribed nystatin ointment. She used a very small amount of it twice a day. Within 24 hours she noticed some redness and itching on her hands. By Saturday a.m. she had developed lip swelling. She went to her local urgent care, and by the time she got there both of her lips were very swollen and she had developed difficulty breathing. She was treated at urgent care with epinephrine and another medication and discharged to home. She stopped using the nystatin ointment on Saturday. On Sunday she felt better, but on Monday the right half of her face developed swelling and redness. She came to the emergency department and was treated with epinephrine, Solu-Medrol, Lo-Trol, Pepcid, and IV fluids. Her facial swelling improved considerably and she was discharged on prednisone and Benadryl. On Tuesday she developed diarrhea and dry heaves. This morning (Wednesday) her hands and feet began to itch, her face swelled and suddenly her throat felt as though it were going to close and she could not breathe and she returned to the ER.  The patient's only other medication is Lexapro. She took Lexapro for approximately 6 months and stopped it in February 2016.  She restarted Lexapro 20 mg approximately one month ago. She denies new soaps, lotions, perfumes, clothes soaps. She denies food allergies.   In the ER labs show a CO2 that is slightly decreased at 21, WBC count 11 (prednisone). She does not have an EKG completed. Urinalysis is pending.  On my exam the patient is no longer itching, she does not have any swelling, she does have severe headache. We have been asked her for observation due to recurrent anaphylactic reactions in quick succession.  Addendum:  At approximately 5:30 pm while in the ER, the patient called out for help. The patient felt as though her throat was closing up and she developed chest pain. The underside of her tongue was swelling, and she developed patchy redness in her face (as witnessed by the bedside RN, and ER PA) St.  She received another epinephrine injection and her symptoms subsided.   Review of Systems  Constitutional: Positive for diaphoresis.  Eyes: Negative.   Respiratory: Negative.   Cardiovascular: Negative.   Gastrointestinal: Positive for nausea and diarrhea.  Genitourinary: Negative.   Musculoskeletal: Negative.   Skin:  Positive for itching and rash.  Neurological: Positive for dizziness, weakness and headaches.  Endo/Heme/Allergies: Positive for environmental allergies.  Psychiatric/Behavioral: The patient has insomnia.     Past Medical History  Past Medical History  Diagnosis Date  . Depression   . Asthma with exacerbation 09/01/2014    Past Surgical History  Procedure Laterality Date  . Knee surgery    . Abdominal hysterectomy    . Laparoscopic gastric banding N/A 2013    performed in [place]  . Cholecystectomy N/A 01/28/2014    Procedure: LAPAROSCOPIC CHOLECYSTECTOMY WITH INTRAOPERATIVE CHOLANGIOGRAM;  Surgeon: Valarie MerinoMatthew B Martin, MD;  Location: [facility];  Service: General;  Laterality: N/A;      Social History Social History  Substance Use Topics  . Smoking status: Never Smoker   .  Smokeless tobacco: Never Used  . Alcohol Use: Yes     Comment: social on occ   lives at home with her 457 year old daughter. Does not use tobacco, denies recreational drugs. Rarely drinks.  Family History Family History  Problem Relation Age of Onset  . Adopted: Yes  . Family history unknown: Yes    Prior to Admission medications   Medication Sig Start Date End Date Taking? Authorizing Provider  escitalopram (LEXAPRO) 20 MG tablet Take 20 mg by mouth daily.   Yes Historical Provider, MD  famotidine (PEPCID) 20 MG tablet Take 1 tablet (20 mg total) by mouth 2 (two) times daily. 04/10/15  Yes Tilden FossaElizabeth Rees, MD  predniSONE (DELTASONE) 10 MG tablet Take 4 tablets (40 mg total) by mouth daily. 04/10/15  Yes Tilden FossaElizabeth Rees, MD  EPINEPHrine 0.3 mg/0.3 mL IJ SOAJ injection Inject 0.3 mLs (0.3 mg total) into the muscle once. 04/10/15   Tilden FossaElizabeth Rees, MD  ibuprofen (ADVIL,MOTRIN) 200 MG tablet Take 400 mg by mouth every 6 (six) hours as needed for headache.    Historical Provider, MD  traMADol (ULTRAM) 50 MG tablet Take 1 tablet (50 mg total) by mouth every 6 (six) hours as needed. Patient not taking: Reported on 04/12/2015 09/05/14   Willodean Rosenthalarolyn Harraway-Smith, MD    Allergies  Allergen Reactions  . Nystatin Anaphylaxis and Swelling    Patient broke out in rash on extremities, had swelling of the nasal passage, throat and lips. Reaction was increasing over the course of 1-3 days.  . Sulfa Antibiotics Hives    Physical Exam  Vitals  Blood pressure 134/76, pulse 84, temperature 98.3 F (36.8 C), temperature source Oral, resp. rate 16, SpO2 97 %.   General: Well-developed, well-nourished female lying in bed in NAD  Psych:  Normal affect and insight, Not Suicidal or Homicidal, Awake Alert, Oriented X 3.  Neuro:   No F.N deficits, ALL C.Nerves Intact, Strength 5/5 all 4 extremities, Sensation intact all 4 extremities.  ENT:  Ears and Eyes appear Normal, Conjunctivae clear, PER. Moist oral  mucosa without erythema or exudates.  Neck:  Supple, left anterior lymph node palpable in her neck  Respiratory:  Symmetrical chest wall movement, Good air movement bilaterally, CTAB.  Cardiac:  RRR, No Murmurs, no LE edema noted, no JVD.    Abdomen:  Positive bowel sounds, Soft, Non tender, Non distended,  No masses appreciated  Skin:  No Cyanosis, Normal Skin Turgor, No Skin Rash or Bruise. 5-10 Very small red macules on the left leg  Extremities:  Able to move all 4. 5/5 strength in each,  no effusions.  Data Review  Wt Readings from Last 3 Encounters:  11/14/14 99.338 kg (219 lb)  09/05/14 113.535 kg (250 lb 4.8 oz)  09/02/14 114.306 kg (252 lb)    CBC  Recent Labs Lab 04/10/15 0857 04/12/15 1640  WBC 10.0 11.0*  HGB 15.5* 12.9  HCT 45.5 39.5  PLT 376 296  MCV 89.7 90.4  MCH 30.6 29.5  MCHC 34.1 32.7  RDW 12.9 12.8  LYMPHSABS 4.0 0.7  MONOABS 0.7 0.1  EOSABS 0.1 0.0  BASOSABS 0.0 0.0    Chemistries   Recent Labs Lab 04/10/15 0857 04/12/15 1640  NA 139 142  K 4.1 3.5  CL 107 111  CO2 24 21*  GLUCOSE 99 126*  BUN 7 12  CREATININE 0.61 0.69  CALCIUM 8.7* 7.8*    Urinalysis: Pending   Imaging results:   No results found.  My personal review of EKG: pending.   Assessment & Plan  Principal Problem:   Anaphylactic reaction Active Problems:   S/P gastric surgery    Anaphylactic reaction Believed to be a hypersensitive reaction to nystatin ointment, but she stopped using that on Saturday (4 days ago).   Will hold lexapro as well. Epi given several times in ER.  Will keep Epi at bedside.   Received solumedrol in ER.  Will order prednisone in am, Benedryl PRN, Pepcid, nebulizers.     Have discussed the patient briefly with Dr. Craige Cotta of PCCM and requested that she be monitored from the Box while she is in step down.    Depression Hold lexapro for now.   Consultants Called:    Discussed with critical care. Will request that they watch her  from the box.  Family Communication:    Patient is alert, orientated and understands their plan of care.  Code Status:    Full  Condition:    guarded  Potential Disposition:   To home when reactions have resolved.  Time spent in minutes : [address]   [facility] [place],  [place] on 04/12/2015 at 5:59 PM Between 7am to 7pm - Pager - [phone] After 7pm go to [email or web address] - [email or web address] And look for the night coverage person covering me after hours

## 2015-04-12 NOTE — ED Provider Notes (Signed)
CSN: [id number]     Arrival date & time 04/12/15  1130 History   First MD Initiated Contact with Patient 04/12/15 1147     Chief Complaint  Patient presents with  . Allergic Reaction     (Consider location/radiation/quality/duration/timing/severity/associated sxs/prior Treatment) HPI   Melissa Dodson is a 44 y.o. female presents with allergic reaction. Pt was prescribed Nystatin cream for a skin condition 5 days ago. Pt states her lips and tongue started to swell on that first day, but states, "I didn't think anything of it."  Pt continued to use the Nystatin until she began to have shortness of breath and felt her throat closing up yesterday. Pt was seen here and treated for anaphylactic reaction. Pt states symptoms resolved by discharge on 10/24, but this morning she woke up and was itchy all over her skin and felt like her face was swollen, but did not seek treatment today until she had chest tightness, nausea, and trouble breathing. Pt has not taken any more benadryl and came straight to the ED. Pt states that the reaction to Nystatin began about 24 hours after starting it.   Past Medical History  Diagnosis Date  . Depression   . Asthma with exacerbation 09/01/2014   Past Surgical History  Procedure Laterality Date  . Knee surgery    . Abdominal hysterectomy    . Laparoscopic gastric banding N/A 2013    [place]  . Cholecystectomy N/A 01/28/2014    Procedure: LAPAROSCOPIC CHOLECYSTECTOMY WITH INTRAOPERATIVE CHOLANGIOGRAM;  Surgeon: Valarie MerinoMatthew B Martin, MD;  Location: [facility];  Service: General;  Laterality: N/A;   Family History  Problem Relation Age of Onset  . Adopted: Yes  . Family history unknown: Yes   Social History  Substance Use Topics  . Smoking status: Never Smoker   . Smokeless tobacco: Never Used  . Alcohol Use: Yes     Comment: social on occ   OB History    Gravida Para Term Preterm AB TAB SAB Ectopic Multiple Living   2 2 1 1            Review  of Systems  Constitutional: Negative for fever, chills and diaphoresis.  Respiratory: Positive for chest tightness, shortness of breath and wheezing.   Cardiovascular: Negative for palpitations.  Gastrointestinal: Positive for nausea. Negative for vomiting and abdominal pain.  Neurological: Negative for dizziness, syncope, weakness, light-headedness, numbness and headaches.  All other systems reviewed and are negative.     Allergies  Nystatin and Sulfa antibiotics  Home Medications   Prior to Admission medications   Medication Sig Start Date End Date Taking? Authorizing Provider  escitalopram (LEXAPRO) 20 MG tablet Take 20 mg by mouth daily.   Yes Historical Provider, MD  famotidine (PEPCID) 20 MG tablet Take 1 tablet (20 mg total) by mouth 2 (two) times daily. 04/10/15  Yes Tilden FossaElizabeth Rees, MD  predniSONE (DELTASONE) 10 MG tablet Take 4 tablets (40 mg total) by mouth daily. 04/10/15  Yes Tilden FossaElizabeth Rees, MD  EPINEPHrine 0.3 mg/0.3 mL IJ SOAJ injection Inject 0.3 mLs (0.3 mg total) into the muscle once. 04/10/15   Tilden FossaElizabeth Rees, MD  ibuprofen (ADVIL,MOTRIN) 200 MG tablet Take 400 mg by mouth every 6 (six) hours as needed for headache.    Historical Provider, MD  traMADol (ULTRAM) 50 MG tablet Take 1 tablet (50 mg total) by mouth every 6 (six) hours as needed. Patient not taking: Reported on 04/12/2015 09/05/14   Willodean Rosenthalarolyn Harraway-Smith, MD   BP  133/79 mmHg  Pulse 86  Temp(Src) 98.3 F (36.8 C) (Oral)  Resp 20  SpO2 97% Physical Exam  Constitutional: She appears well-developed and well-nourished. No distress.  HENT:  Head: Normocephalic and atraumatic.  Minor swelling and redness to pt face, lips, and tongue. No notable outer throat swelling.   Eyes: Conjunctivae are normal. Pupils are equal, round, and reactive to light.  Cardiovascular: Normal rate, regular rhythm and normal heart sounds.   Pulmonary/Chest: Effort normal. No accessory muscle usage. She has wheezes in the right  upper field, the right middle field, the right lower field, the left upper field, the left middle field and the left lower field.  Pt moves good air and can speak in full sentences.   Abdominal: Soft. Bowel sounds are normal.  Musculoskeletal: She exhibits no edema or tenderness.  Neurological: She is alert.  Skin: Skin is warm and dry. She is not diaphoretic.  No hives or wheals noted.   Nursing note and vitals reviewed.   ED Course  Procedures (including critical care time) Labs Review Labs Reviewed  CBC WITH DIFFERENTIAL/PLATELET  BASIC METABOLIC PANEL    Imaging Review No results found. I have personally reviewed and evaluated these images and lab results as part of my medical decision-making.   EKG Interpretation None      MDM   Final diagnoses:  Allergic reaction, subsequent encounter    [name] presents with allergic reaction from use of Nystatin cream.  Findings and plan of care discussed with Blake Divine, MD.  Pt treated for allergic reaction. Airway is clear and pt seems to be responding well to initial treatment. Pt closely monitored for worsening condition. No indication for epinephrine at this time.  1:05 PM Pt reassessed. Pt denies any throat swelling, trouble breathing, itchiness, or any of her previous complaints. Pt is resting comfortably. Pt was advised that she was going to be observed for a few hours to make sure her reaction didn't resurface. Pt agreed to this plan of care.  2:19 PM Pt states she is starting to have symptoms again. Complains of itching over her body and dizziness. Pt denies shortness of breath, chest tightness, or swelling in her throat, lips, or tongue. Pt meets criteria for anaphylaxis. Ordered EpiPen. If pt receives relief from this, pt will be admitted for anaphylaxis.  3:06 PM Pt reassessed. Pt received relief from EpiPen within a few minutes. Pt to be admitted. Pt updated on new plan of care and pt agreed.  3:59 PM Pt  reassessed. No current signs or symptoms of anaphylaxis or allergic reaction. Pt now has a headache following the EpiPen. Still awaiting call from hospitalist.  Hospitalist, Algis Downs, agreed to admit pt to telemetry bed for observation.  5:16 PM Pt began to complain of dizziness and swelling in her throat again. Hospitalist consult request placed to update them on pt condition.  Algis Downs returned page and updated.   Anselm Pancoast, PA-C 04/12/15 1617  Anselm Pancoast, PA-C 04/12/15 1731  Blake Divine, MD 04/13/15 [phone]

## 2015-04-13 ENCOUNTER — Observation Stay (HOSPITAL_COMMUNITY): Payer: Medicaid Other

## 2015-04-13 DIAGNOSIS — R590 Localized enlarged lymph nodes: Secondary | ICD-10-CM | POA: Diagnosis present

## 2015-04-13 DIAGNOSIS — L508 Other urticaria: Secondary | ICD-10-CM | POA: Diagnosis present

## 2015-04-13 DIAGNOSIS — F329 Major depressive disorder, single episode, unspecified: Secondary | ICD-10-CM | POA: Diagnosis not present

## 2015-04-13 DIAGNOSIS — F32A Depression, unspecified: Secondary | ICD-10-CM | POA: Diagnosis present

## 2015-04-13 DIAGNOSIS — T7840XA Allergy, unspecified, initial encounter: Secondary | ICD-10-CM | POA: Diagnosis not present

## 2015-04-13 LAB — URINALYSIS, ROUTINE W REFLEX MICROSCOPIC
BILIRUBIN URINE: NEGATIVE
GLUCOSE, UA: NEGATIVE mg/dL
HGB URINE DIPSTICK: NEGATIVE
Ketones, ur: NEGATIVE mg/dL
Leukocytes, UA: NEGATIVE
Nitrite: NEGATIVE
PROTEIN: NEGATIVE mg/dL
Specific Gravity, Urine: 1.012 (ref 1.005–1.030)
UROBILINOGEN UA: 0.2 mg/dL (ref 0.0–1.0)
pH: 6 (ref 5.0–8.0)

## 2015-04-13 LAB — CBC
HEMATOCRIT: 40.1 % (ref 36.0–46.0)
Hemoglobin: 13.3 g/dL (ref 12.0–15.0)
MCH: 30.1 pg (ref 26.0–34.0)
MCHC: 33.2 g/dL (ref 30.0–36.0)
MCV: 90.7 fL (ref 78.0–100.0)
PLATELETS: 322 10*3/uL (ref 150–400)
RBC: 4.42 MIL/uL (ref 3.87–5.11)
RDW: 13 % (ref 11.5–15.5)
WBC: 10.5 10*3/uL (ref 4.0–10.5)

## 2015-04-13 LAB — SEDIMENTATION RATE: SED RATE: 0 mm/h (ref 0–22)

## 2015-04-13 LAB — BASIC METABOLIC PANEL
Anion gap: 7 (ref 5–15)
BUN: 11 mg/dL (ref 6–20)
CO2: 23 mmol/L (ref 22–32)
CREATININE: 0.66 mg/dL (ref 0.44–1.00)
Calcium: 8.1 mg/dL — ABNORMAL LOW (ref 8.9–10.3)
Chloride: 105 mmol/L (ref 101–111)
GFR calc Af Amer: >60 mL/min (ref >60)
GLUCOSE: 129 mg/dL — AB (ref 65–99)
POTASSIUM: 3.7 mmol/L (ref 3.5–5.1)
SODIUM: 135 mmol/L (ref 135–145)

## 2015-04-13 LAB — MONONUCLEOSIS SCREEN: Mono Screen: NEGATIVE

## 2015-04-13 LAB — C-REACTIVE PROTEIN

## 2015-04-13 MED ORDER — CYCLOBENZAPRINE HCL 10 MG PO TABS
10.0000 mg | ORAL_TABLET | Freq: Once | ORAL | Status: AC
  Administered 2015-04-13: 10 mg via ORAL
  Filled 2015-04-13: qty 1

## 2015-04-13 MED ORDER — EPINEPHRINE 0.3 MG/0.3ML IJ SOAJ
0.3000 mg | INTRAMUSCULAR | Status: DC | PRN
  Administered 2015-04-14 (×2): 0.3 mg via INTRAMUSCULAR
  Filled 2015-04-13 (×4): qty 0.6

## 2015-04-13 MED ORDER — LORATADINE 10 MG PO TABS
10.0000 mg | ORAL_TABLET | Freq: Two times a day (BID) | ORAL | Status: DC
  Administered 2015-04-13 – 2015-04-16 (×6): 10 mg via ORAL
  Filled 2015-04-13 (×8): qty 1

## 2015-04-13 MED ORDER — OXYCODONE-ACETAMINOPHEN 5-325 MG PO TABS
2.0000 | ORAL_TABLET | Freq: Once | ORAL | Status: AC
  Administered 2015-04-13: 2 via ORAL
  Filled 2015-04-13: qty 2

## 2015-04-13 MED ORDER — INFLUENZA VAC SPLIT QUAD 0.5 ML IM SUSY
0.5000 mL | PREFILLED_SYRINGE | INTRAMUSCULAR | Status: DC
  Filled 2015-04-13: qty 0.5

## 2015-04-13 MED ORDER — LORATADINE 10 MG PO TABS: 10.0000 mg | ORAL_TABLET | Freq: Every day | ORAL | Status: DC

## 2015-04-13 MED ORDER — DIPHENHYDRAMINE HCL 50 MG/ML IJ SOLN
50.0000 mg | Freq: Four times a day (QID) | INTRAMUSCULAR | Status: DC | PRN
  Administered 2015-04-13 – 2015-04-14 (×4): 50 mg via INTRAVENOUS
  Filled 2015-04-13 (×3): qty 1

## 2015-04-13 MED ORDER — LORAZEPAM 2 MG/ML IJ SOLN
0.5000 mg | Freq: Two times a day (BID) | INTRAMUSCULAR | Status: DC
  Administered 2015-04-13 – 2015-04-16 (×6): 0.5 mg via INTRAVENOUS
  Filled 2015-04-13 (×6): qty 1

## 2015-04-13 MED ORDER — FAMOTIDINE 20 MG PO TABS: 20.0000 mg | ORAL_TABLET | Freq: Two times a day (BID) | ORAL | Status: DC

## 2015-04-13 NOTE — Progress Notes (Signed)
Pt c/o increasing throat tightness. IM epi given. Pt states relief of airway tightness but "feels dizzy".  BP 144/74, HR 89, RR 16, Sat 97%.  Will continue to monitor.

## 2015-04-13 NOTE — Progress Notes (Signed)
Patient c/o throat tightness.  IM epi given.  Patient reports relief of tightness.  Pt also given PRN Tylenol to avoid headache she states she has had before after receiving epi.  BP 153/82, HR 76, RR 19, Sat 98% on 2L. Will continue to monitor.

## 2015-04-13 NOTE — Progress Notes (Signed)
[place] TEAM 1 - Stepdown/ICU TEAM Progress Note  Melissa CoonsJennifer E Dodson RUE:[id number] DOB: Oct 16, 1970 DOA: 04/12/2015 PCP: PROVIDER NOT IN SYSTEM  Admit HPI / Brief Narrative: [name] is a 44 y.o. WF PMHx Depression, Asthma   who presents to the ER for the second time this week and an anaphylactic reaction. The patient's only new medication is nystatin. Last week on either October 18 or 19th the patient went to her primary care physician for irritation around her nose. She was prescribed nystatin ointment. She used a very small amount of it twice a day. Within 24 hours she noticed some redness and itching on her hands. By Saturday a.m. she had developed lip swelling. She went to her local urgent care, and by the time she got there both of her lips were very swollen and she had developed difficulty breathing. She was treated at urgent care with epinephrine and another medication and discharged to home. She stopped using the nystatin ointment on Saturday. On Sunday she felt better, but on Monday the right half of her face developed swelling and redness. She came to the emergency department and was treated with epinephrine, Solu-Medrol, Lo-Trol, Pepcid, and IV fluids. Her facial swelling improved considerably and she was discharged on prednisone and Benadryl. On Tuesday she developed diarrhea and dry heaves. This morning (Wednesday) her hands and feet began to itch, her face swelled and suddenly her throat felt as though it were going to close and she could not breathe and she returned to the ER.  The patient's only other medication is Lexapro. She took Lexapro for approximately 6 months and stopped it in February 2016. She restarted Lexapro 20 mg approximately one month ago. She denies new soaps, lotions, perfumes, clothes soaps. She denies food allergies.   In the ER labs show a CO2 that is slightly decreased at 21, WBC count 11 (prednisone). She does not have an EKG completed. Urinalysis  is pending. On my exam the patient is no longer itching, she does not have any swelling, she does have severe headache. We have been asked her for observation due to recurrent anaphylactic reactions in quick succession.  Addendum: At approximately 5:30 pm while in the ER, the patient called out for help. The patient felt as though her throat was closing up and she developed chest pain. The underside of her tongue was swelling, and she developed patchy redness in her face (as witnessed by the bedside RN, and ER PA) St. She received another epinephrine injection and her symptoms subsided.  HPI/Subjective: 10/27 patient states the current signs symptoms did not start with exposure to cold weather, negative bowel issues. States no individual lesions. Started after she began using nystatin ~10 days ago on Monday. Began with swelling and itching of bilateral hands and feet, then face and back of throat. By Saturday started becoming short of breath. Was seen on Monday in the ED treated with resolution. However symptoms returned again starting in her hands and feet with itching, redness, swelling and then progressing to include face and throat with positive SOB. States currently having some pain with deep inspiration  RN Kriste BasqueBecky noted that every time patient had a flare was postprandial and patient had consumed a product with some formal peanuts in it.  Assessment/Plan: Anaphylactic reaction/Allergic reaction -Believed to be a hypersensitive reaction to nystatin ointment, but she stopped using that on Saturday (4 days ago).  -Will hold lexapro as well. - Will keep Epi at bedside.  Received solumedrol in  ER. Will order prednisone in am, Benedryl PRN, Pepcid, nebulizers.   Acquired C1 Esterase inhibitor deficiency?/Urticarial Vasculitis?/Leuko-clastic vasculitis?/Infection?/Malignancy? 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 Start loratadine 10 mg BID -Famotidine 20 mg  BID -Continue Benadryl 50 mg QID PRN  -Continue prednisone 50 mg daily -Nothing by mouth -KEEP EPI AT BEDSIDE.  PATIENT HAS HAD MULTIPLE REACTIONS IN ER TODAY. Administer only if patient exhibits respiratory distress i.e. stridor, wheezing, decreased SPO2, dyspnea, increased work of breathing, persistent cough, cyanosis -If patient continues to have recurrent symptoms contact on call physician for orders to start epinephrine continuous infusion, beginning at 0.1 mcg/kg/minute  -Submandibular lymphadenopathy; soft tissue neck CT-nondiagnostic .  Depression Hold lexapro for now.  Anxiety -Ativan 0.5 mg BID    Code Status: FULL Family Communication: no family present at time of exam Disposition Plan: Discharge after patient has 24 hour period without recurrence of symptoms    Consultants: NA  Procedure/Significant Events: 10/27 CT neck soft tissue; nondiagnostic   Culture NA  Antibiotics: NA  DVT prophylaxis: Lovenox   Devices  LINES / TUBES:      Continuous Infusions: . sodium chloride 75 mL/hr at 04/13/15 0825    Objective: VITAL SIGNS: Temp: 98 F (36.7 C) (10/27 1531) Temp Source: Oral (10/27 1531) BP: 154/75 mmHg (10/27 1525) Pulse Rate: 85 (10/27 1525) SPO2; FIO2:   Intake/Output Summary (Last 24 hours) at 04/13/15 1923 Last data filed at 04/13/15 1700  Gross per 24 hour  Intake   2685 ml  Output    701 ml  Net   1984 ml     Exam: General: A/O 4, NAD, No acute respiratory distress Eyes: Negative headache, eye pain, double vision,negative scleral hemorrhage ENT: Negative Runny nose, negative ear pain, negative gingival bleeding, positive enlarged submandibular LN negative pain to palpation Neck:  Negative scars, masses, torticollis, lymphadenopathy, JVD Lungs: Clear to auscultation bilaterally without wheezes or crackles Cardiovascular: Regular rate and rhythm without murmur gallop or rub normal S1 and S2 Abdomen:negative abdominal  pain, nondistended, positive soft, bowel sounds, no rebound, no ascites, no appreciable mass Extremities: No significant cyanosis, clubbing, or edema bilateral lower extremities Psychiatric:  Negative depression, negative anxiety, negative fatigue, negative mania  Neurologic:  Cranial nerves II through XII intact, tongue/uvula midline, all extremities muscle strength 5/5, sensation intact throughout, negative dysarthria, negative expressive aphasia, negative receptive aphasia.   Data Reviewed: Basic Metabolic Panel:  Recent Labs Lab 04/10/15 0857 04/12/15 1640 04/13/15 0329  NA 139 142 135  K 4.1 3.5 3.7  CL 107 111 105  CO2 24 21* 23  GLUCOSE 99 126* 129*  BUN CREATININE 0.61 0.69 0.66  CALCIUM 8.7* 7.8* 8.1*   Liver Function Tests: No results for input(s): AST, ALT, ALKPHOS, BILITOT, PROT, ALBUMIN in the last 168 hours. No results for input(s): LIPASE, AMYLASE in the last 168 hours. No results for input(s): AMMONIA in the last 168 hours. CBC:  Recent Labs Lab 04/10/15 0857 04/12/15 1640 04/13/15 0329  WBC 10.0 11.0* 10.5  NEUTROABS 5.2 10.2*  --   HGB 15.5* 12.9 13.3  HCT 45.5 39.5 40.1  MCV 89.7 90.4 90.7  PLT 376 296 322   Cardiac Enzymes: No results for input(s): CKTOTAL, CKMB, CKMBINDEX, TROPONINI in the last 168 hours. BNP (last 3 results) No results for input(s): BNP in the last 8760 hours.  ProBNP (last 3 results) No results for input(s): PROBNP in the last 8760 hours.  CBG: No results for input(s): GLUCAP  in the last 168 hours.  No results found for this or any previous visit (from the past 240 hour(s)).   Studies:  Recent x-ray studies have been reviewed in detail by the Attending Physician  Scheduled Meds:  Scheduled Meds: . enoxaparin (LOVENOX) injection  40 mg Subcutaneous Q24H  . famotidine  20 mg Oral BID  . [START ON 04/14/2015] Influenza vac split quadrivalent PF  0.5 mL Intramuscular Tomorrow-1000  . loratadine  10 mg Oral  BID  . LORazepam  0.5 mg Intravenous BID  . predniSONE  50 mg Oral Q breakfast  . sodium chloride  3 mL Intravenous Q12H  . sodium chloride  3 mL Intravenous Q12H    Time spent on care of this patient: 40 mins   Drexel Ivey, Roselind Messier , MD  [facility] Office  [phone] Pager - [phone]  On-Call/Text Page:      [email or web address]      password [email or web address]  If 7PM-7AM, please contact night-coverage [email or web address] Password [facility] 04/13/2015, 7:23 PM     Care during the described time interval was provided by me .  I have reviewed this patient's available data, including medical history, events of note, physical examination, and all test results as part of my evaluation. I have personally reviewed and interpreted all radiology studies.   Carolyne Littles, MD [phone] Pager

## 2015-04-14 DIAGNOSIS — L299 Pruritus, unspecified: Secondary | ICD-10-CM | POA: Diagnosis present

## 2015-04-14 DIAGNOSIS — T886XXA Anaphylactic reaction due to adverse effect of correct drug or medicament properly administered, initial encounter: Secondary | ICD-10-CM | POA: Diagnosis present

## 2015-04-14 DIAGNOSIS — T7840XD Allergy, unspecified, subsequent encounter: Secondary | ICD-10-CM

## 2015-04-14 DIAGNOSIS — T783XXA Angioneurotic edema, initial encounter: Secondary | ICD-10-CM | POA: Diagnosis present

## 2015-04-14 DIAGNOSIS — L508 Other urticaria: Secondary | ICD-10-CM

## 2015-04-14 DIAGNOSIS — Y848 Other medical procedures as the cause of abnormal reaction of the patient, or of later complication, without mention of misadventure at the time of the procedure: Secondary | ICD-10-CM | POA: Diagnosis present

## 2015-04-14 DIAGNOSIS — F329 Major depressive disorder, single episode, unspecified: Secondary | ICD-10-CM | POA: Diagnosis present

## 2015-04-14 DIAGNOSIS — T490X5A Adverse effect of local antifungal, anti-infective and anti-inflammatory drugs, initial encounter: Secondary | ICD-10-CM | POA: Diagnosis present

## 2015-04-14 DIAGNOSIS — Z79899 Other long term (current) drug therapy: Secondary | ICD-10-CM | POA: Diagnosis not present

## 2015-04-14 DIAGNOSIS — T782XXD Anaphylactic shock, unspecified, subsequent encounter: Secondary | ICD-10-CM | POA: Diagnosis not present

## 2015-04-14 DIAGNOSIS — J45909 Unspecified asthma, uncomplicated: Secondary | ICD-10-CM | POA: Diagnosis present

## 2015-04-14 DIAGNOSIS — L509 Urticaria, unspecified: Secondary | ICD-10-CM | POA: Diagnosis present

## 2015-04-14 DIAGNOSIS — F419 Anxiety disorder, unspecified: Secondary | ICD-10-CM | POA: Diagnosis present

## 2015-04-14 LAB — C1 ESTERASE INHIBITOR: C1INH SerPl-mCnc: 26 mg/dL (ref 21–39)

## 2015-04-14 LAB — SJOGRENS SYNDROME-B EXTRACTABLE NUCLEAR ANTIBODY

## 2015-04-14 LAB — COMPLEMENT, TOTAL: COMPL TOTAL (CH50): 52 U/mL (ref 42–60)

## 2015-04-14 LAB — HEPATITIS PANEL, ACUTE
HCV Ab: <0.1 s/co ratio (ref 0.0–0.9)
HEP A IGM: NEGATIVE
Hep B C IgM: NEGATIVE
Hepatitis B Surface Ag: NEGATIVE

## 2015-04-14 LAB — ANTI-DNA ANTIBODY, DOUBLE-STRANDED

## 2015-04-14 LAB — EPSTEIN BARR VRS(EBV DNA BY PCR)
EBV DNA QN by PCR: NEGATIVE copies/mL
LOG10 EBV DNA QN PCR: UNDETERMINED {Log_copies}/mL

## 2015-04-14 LAB — C4 COMPLEMENT: Complement C4, Body Fluid: 22 mg/dL (ref 14–44)

## 2015-04-14 LAB — SJOGRENS SYNDROME-A EXTRACTABLE NUCLEAR ANTIBODY

## 2015-04-14 LAB — ANTINUCLEAR ANTIBODIES, IFA: ANTINUCLEAR ANTIBODIES, IFA: NEGATIVE

## 2015-04-14 LAB — C3 COMPLEMENT: C3 COMPLEMENT: 91 mg/dL (ref 82–167)

## 2015-04-14 MED ORDER — METHYLPREDNISOLONE SODIUM SUCC 125 MG IJ SOLR
60.0000 mg | Freq: Two times a day (BID) | INTRAMUSCULAR | Status: DC
  Administered 2015-04-14 – 2015-04-16 (×4): 60 mg via INTRAVENOUS
  Filled 2015-04-14 (×2): qty 0.96
  Filled 2015-04-14: qty 2
  Filled 2015-04-14: qty 0.96
  Filled 2015-04-14: qty 2
  Filled 2015-04-14 (×2): qty 0.96
  Filled 2015-04-14: qty 2

## 2015-04-14 MED ORDER — DIPHENHYDRAMINE HCL 50 MG PO CAPS
50.0000 mg | ORAL_CAPSULE | Freq: Four times a day (QID) | ORAL | Status: DC
  Administered 2015-04-14 – 2015-04-15 (×5): 50 mg via ORAL
  Filled 2015-04-14 (×2): qty 1
  Filled 2015-04-14: qty 2
  Filled 2015-04-14 (×4): qty 1
  Filled 2015-04-14 (×3): qty 2
  Filled 2015-04-14: qty 1

## 2015-04-14 MED ORDER — OXYCODONE-ACETAMINOPHEN 5-325 MG PO TABS
2.0000 | ORAL_TABLET | Freq: Once | ORAL | Status: AC
  Administered 2015-04-14: 2 via ORAL
  Filled 2015-04-14: qty 2

## 2015-04-14 MED ORDER — SODIUM CHLORIDE 0.9 % IV SOLN
INTRAVENOUS | Status: DC
  Administered 2015-04-14: 18:00:00 via INTRAVENOUS

## 2015-04-14 MED ORDER — OXYCODONE-ACETAMINOPHEN 5-325 MG PO TABS
1.0000 | ORAL_TABLET | Freq: Four times a day (QID) | ORAL | Status: DC | PRN
  Administered 2015-04-14 – 2015-04-15 (×3): 2 via ORAL
  Filled 2015-04-14 (×3): qty 2

## 2015-04-14 MED ORDER — TRAZODONE HCL 50 MG PO TABS
50.0000 mg | ORAL_TABLET | Freq: Once | ORAL | Status: AC
  Administered 2015-04-14: 50 mg via ORAL
  Filled 2015-04-14: qty 1

## 2015-04-14 MED ORDER — EPINEPHRINE HCL 1 MG/ML IJ SOLN
0.3000 mg | INTRAMUSCULAR | Status: DC | PRN
  Administered 2015-04-15: 0.3 mg via INTRAMUSCULAR
  Filled 2015-04-14: qty 1

## 2015-04-14 MED ORDER — ALBUTEROL SULFATE (2.5 MG/3ML) 0.083% IN NEBU
2.5000 mg | INHALATION_SOLUTION | RESPIRATORY_TRACT | Status: DC | PRN
  Administered 2015-04-14: 2.5 mg via RESPIRATORY_TRACT
  Filled 2015-04-14: qty 3

## 2015-04-14 NOTE — Progress Notes (Signed)
[place] TEAM 1 - Stepdown/ICU TEAM PROGRESS NOTE  Melissa Dodson MWN:[id number] DOB: 08-10-1970 DOA: 04/12/2015 PCP: PROVIDER NOT IN SYSTEM  Admit HPI / Brief Narrative: 44 y.o. F Hx Depression and Asthma who presented to the ER for the second time in a week with an apparent anaphylactic reaction. The patient's only new medication was nystatin. On either October 18 or 19th the patient went to her primary care physician for irritation around her nose. She was prescribed nystatin ointment. She used a very small amount of it twice a day. Within 24 hours she noticed some redness and itching on her hands. By Saturday a.m. she had developed lip swelling. She went to her local urgent care, and by the time she got there both of her lips were very swollen and she had developed difficulty breathing. She was treated at urgent care with epinephrine and discharged to home. She stopped using the nystatin ointment on Saturday. On Sunday she felt better, but on Monday the right half of her face developed swelling and redness. She came to the emergency department and was treated with epinephrine, Solu-Medrol, Pepcid, and IV fluids. Her facial swelling improved considerably and she was discharged on prednisone and Benadryl. On Tuesday she developed diarrhea and dry heaves. Wednesday her hands and feet began to itch, her face swelled and suddenly her throat felt as though it were going to close and she could not breathe and she returned to the ER.  The patient's only other medication is Lexapro. She took Lexapro for approximately 6 months and stopped it in February 2016. She restarted Lexapro 20 mg approximately one month ago. She denies new soaps, lotions, perfumes, clothes soap. She denies known food allergies.   Addendum: At approximately 5:30 pm while in the ER, the patient called out for help. The patient felt as though her throat was closing up and she developed chest pain. The underside of her tongue was  swelling, and she developed patchy redness in her face (as witnessed by the bedside RN, and ER PA). She received another epinephrine injection and her symptoms subsided.  HPI/Subjective: The patient experienced a recurrence of her symptoms this afternoon.  She describes urticaria which the nurse witnessed which were intensely pruritic.  The symptoms did not resolve with Benadryl.  Shortly thereafter the patient complained of "feeling that her throat was closing up."  The nurse then gave her an epinephrine injection after which she reported the sensation in her throat quickly resolved but that the itching persisted.  She currently denies chest pain nausea vomiting or abdominal pain but does still have diffuse itching though her rash appears to have resolved.  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 rate 15, SpO2 96 %.  Intake/Output Summary (Last 24 hours) at 04/14/15 1613 Last data filed at 04/14/15 1200  Gross per 24 hour  Intake    1575 ml  Output      0 ml  Net   1575 ml   Exam: General: No acute respiratory distress - no stridor  Lungs: Clear to auscultation bilaterally without wheezes or crackles Cardiovascular: Regular rate and rhythm without murmur gallop or rub normal S1 and S2 Abdomen: Nontender, nondistended, soft, bowel sounds positive, no rebound, no ascites, no appreciable mass Extremities: No significant cyanosis, clubbing, or edema bilateral lower extremities  Data Reviewed: Basic Metabolic Panel:  Recent Labs Lab 04/10/15 0857 04/12/15 1640 04/13/15 0329  NA 139 142 135  K 4.1 3.5 3.7  CL 107 111 105  CO2 24 21* 23  GLUCOSE 99 126* 129*  BUN '7 12 11  ' CREATININE 0.61 0.69 0.66  CALCIUM 8.7* 7.8* 8.1*    CBC:  Recent Labs Lab 04/10/15 0857 04/12/15 1640 04/13/15 0329  WBC 10.0 11.0* 10.5  NEUTROABS 5.2 10.2*  --   HGB 15.5* 12.9 13.3  HCT 45.5 39.5 40.1  MCV 89.7 90.4 90.7  PLT 376 296 322   Studies:   Recent x-ray studies have been reviewed in detail by the Attending Physician  Scheduled Meds:  Scheduled Meds: . enoxaparin (LOVENOX) injection  40 mg Subcutaneous Q24H  . famotidine  20 mg Oral BID  . Influenza vac split quadrivalent PF  0.5 mL Intramuscular Tomorrow-1000  . loratadine  10 mg Oral BID  . LORazepam  0.5 mg Intravenous BID  . predniSONE  50 mg Oral Q breakfast  . sodium chloride  3 mL Intravenous Q12H  . sodium chloride  3 mL Intravenous Q12H    Time spent on care of this patient: 35 mins   MCCLUNG,JEFFREY T , MD   [facility] Office  [phone] Pager - Text Page per Shea Evans as per below:  On-Call/Text Page:      [email or web address]      password [email or web address]  If 7PM-7AM, please contact night-coverage [email or web address] Password [email or web address] 04/14/2015, 4:13 PM

## 2015-04-15 MED ORDER — DICLOFENAC SODIUM 50 MG PO TBEC
50.0000 mg | DELAYED_RELEASE_TABLET | Freq: Three times a day (TID) | ORAL | Status: DC
  Administered 2015-04-15 – 2015-04-16 (×3): 50 mg via ORAL
  Filled 2015-04-15 (×6): qty 1

## 2015-04-15 MED ORDER — HYDROXYZINE HCL 50 MG PO TABS
50.0000 mg | ORAL_TABLET | Freq: Four times a day (QID) | ORAL | Status: DC
  Administered 2015-04-15 – 2015-04-16 (×4): 50 mg via ORAL
  Filled 2015-04-15 (×7): qty 1

## 2015-04-15 MED ORDER — OXYCODONE-ACETAMINOPHEN 5-325 MG PO TABS: 1.0000 | ORAL_TABLET | ORAL | Status: DC | PRN

## 2015-04-15 MED ORDER — TRAZODONE HCL 50 MG PO TABS
50.0000 mg | ORAL_TABLET | Freq: Once | ORAL | Status: AC
  Administered 2015-04-15: 50 mg via ORAL
  Filled 2015-04-15: qty 1

## 2015-04-15 NOTE — Progress Notes (Signed)
[facility] TEAM 1 - Stepdown/ICU TEAM PROGRESS NOTE  HARVEST DEIST WRU:[id number] DOB: [birth year] DOA: 04/12/2015 PCP: PROVIDER NOT IN SYSTEM  Admit HPI / Brief Narrative: 44 y.o. F Hx Depression and Asthma who presented to the ER for the second time in a week with an apparent anaphylactic reaction. The patient's only new medication was nystatin. On either October 18 or 19th the patient went to her primary care physician for irritation around her nose. She was prescribed nystatin ointment. She used a very small amount of it twice a day. Within 24 hours she noticed some redness and itching on her hands. By Saturday a.m. she had developed lip swelling. She went to her local urgent care, and by the time she got there both of her lips were very swollen and she had developed difficulty breathing. She was treated at urgent care with epinephrine and discharged to home. She stopped using the nystatin ointment on Saturday. On Sunday she felt better, but on Monday the right half of her face developed swelling and redness. She came to the emergency department and was treated with epinephrine, Solu-Medrol, Pepcid, and IV fluids. Her facial swelling improved considerably and she was discharged on prednisone and Benadryl. On Tuesday she developed diarrhea and dry heaves. Wednesday her hands and feet began to itch, her face swelled and suddenly her throat felt as though it were going to close and she could not breathe and she returned to the ER.  The patient's only other medication is Lexapro. She took Lexapro for approximately 6 months and stopped it in February 2016. She restarted Lexapro 20 mg approximately one month ago. She denies new soaps, lotions, perfumes, clothes soap. She denies known food allergies.   Addendum: At approximately 5:30 pm while in the ER, the patient called out for help. The patient felt as though her throat was closing up and she developed chest pain. The underside of her tongue was  swelling, and she developed patchy redness in her face (as witnessed by the bedside RN, and ER PA). She received another epinephrine injection and her symptoms subsided.  HPI/Subjective: The patient has been given 1 dose of epinephrine today during an episode during which she reported that her throat felt like it was closing up.  She was not wheezing nor did she exhibit stridor at time per the RN.  At the time of my exam she is complaining of diffuse severe itching and severe "aching in my bones all over."  She denies current shortness of breath, wheezing, facial swelling, tongue swelling, nausea vomiting, chest pain, abdominal pain.  Assessment/Plan:  Anaphylactic reaction / Allergic reaction -Believed initially to be a hypersensitive reaction to nystatin ointment, but she stopped using that many days ago -have now stopped lexapro -keep Epi at bedside but have counseled patient that I do not wish to continue using it intermittently and will only dose that if she has on questionable objective findings such as angioedema or definite stridor/wheeze -experienced recurrence of throat tightening 10/28 afternoon, necessitating another dose of epi, to which her sx responded - and again in the morning of 10/29 -differential is quite broad and includes:  acquired C1 Esterase inhibitor deficiency, urticarial vasculitis, infection -all complement levels normal, as well as CRP and ESR  -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  rate 19, height 5' 10.5" (1.791 m), weight 98.884 kg (218 lb), SpO2 96 %.  Intake/Output Summary (Last 24 hours) at 04/15/15 1607 Last data filed at 04/15/15 0800  Gross per 24 hour  Intake    825 ml  Output      0 ml  Net    825 ml   Exam: General: No acute respiratory distress - no stridor - alert and conversant Lungs: Clear to auscultation bilaterally without wheezes / crackles Cardiovascular: Regular rate and rhythm without murmur gallop or rub Abdomen: Nontender, nondistended, soft, bowel sounds positive, no rebound, no ascites, no appreciable mass Extremities: No significant cyanosis, clubbing, edema bilateral lower extremities  Data Reviewed: Basic Metabolic Panel:  Recent Labs Lab 04/10/15 0857 04/12/15 1640 04/13/15 0329  NA 139 142 135  K 4.1 3.5 3.7  CL 107 111 105  CO2 24 21* 23  GLUCOSE 99 126* 129*  BUN _0 CREATININE 0.61 0.69 0.66  CALCIUM 8.7* 7.8* 8.1*   CBC:  Recent Labs Lab 04/10/15 0857 04/12/15 1640 04/13/15 0329  WBC 10.0 11.0* 10.5  NEUTROABS 5.2 10.2*  --   HGB 15.5* 12.9 13.3  HCT 45.5 39.5 40.1  MCV 89.7 90.4 90.7  PLT 376 296 322   Studies:   Recent x-ray studies have been reviewed in detail by the Attending Physician  Scheduled Meds:  Scheduled Meds: . diphenhydrAMINE  50 mg Oral 4 times per day  . enoxaparin (LOVENOX) injection  40 mg Subcutaneous Q24H  . famotidine  20 mg Oral BID  . loratadine  10 mg Oral BID  . LORazepam  0.5 mg Intravenous BID  . methylPREDNISolone (SOLU-MEDROL) injection  60 mg Intravenous Q12H    Time spent on care of this patient: 35 mins   MCCLUNG,JEFFREY T , MD   [facility] Office  [phone] Pager - Text Page per Shea Evans as per below:  On-Call/Text Page:      [email or web address]      password [email or web address]  If 7PM-7AM, please contact night-coverage [email or web address] Password [email or web address] 04/15/2015, 4:07 PM   LOS: 1 day

## 2015-04-16 LAB — LYME DISEASE DNA BY PCR(BORRELIA BURG): Lyme Disease(B.burgdorferi)PCR: NEGATIVE

## 2015-04-16 MED ORDER — OXYCODONE-ACETAMINOPHEN 5-325 MG PO TABS: 1.0000 | ORAL_TABLET | ORAL | Status: DC | PRN

## 2015-04-16 MED ORDER — PREDNISONE 50 MG PO TABS
60.0000 mg | ORAL_TABLET | Freq: Two times a day (BID) | ORAL | Status: DC
  Filled 2015-04-16 (×2): qty 1

## 2015-04-16 MED ORDER — DM-GUAIFENESIN ER 30-600 MG PO TB12: 1.0000 | ORAL_TABLET | Freq: Two times a day (BID) | ORAL | Status: DC | PRN

## 2015-04-16 MED ORDER — EPINEPHRINE 0.3 MG/0.3ML IJ SOAJ: 0.3000 mg | Freq: Once | INTRAMUSCULAR | Status: AC

## 2015-04-16 MED ORDER — ONDANSETRON HCL 4 MG PO TABS: 4.0000 mg | ORAL_TABLET | Freq: Four times a day (QID) | ORAL | Status: DC | PRN

## 2015-04-16 MED ORDER — HYDROXYZINE HCL 25 MG PO TABS: 25.0000 mg | ORAL_TABLET | Freq: Four times a day (QID) | ORAL | Status: DC

## 2015-04-16 MED ORDER — DM-GUAIFENESIN ER 30-600 MG PO TB12
1.0000 | ORAL_TABLET | Freq: Two times a day (BID) | ORAL | Status: DC
Start: 2015-04-16 — End: 2015-04-16
  Filled 2015-04-16 (×2): qty 1

## 2015-04-16 MED ORDER — PREDNISONE 20 MG PO TABS: ORAL_TABLET | ORAL | Status: DC

## 2015-04-16 MED ORDER — LORATADINE 10 MG PO TABS: 10.0000 mg | ORAL_TABLET | Freq: Every day | ORAL | Status: DC

## 2015-04-16 MED ORDER — FAMOTIDINE 20 MG PO TABS: 20.0000 mg | ORAL_TABLET | Freq: Two times a day (BID) | ORAL | Status: DC

## 2015-04-16 NOTE — Discharge Instructions (Signed)
Anaphylactic Reaction °An anaphylactic reaction is a sudden, severe allergic reaction that involves the whole body. It can be life threatening. A hospital stay is often required. People with asthma, eczema, or hay fever are slightly more likely to have an anaphylactic reaction. °CAUSES  °An anaphylactic reaction may be caused by anything to which you are allergic. After being exposed to the allergic substance, your immune system becomes sensitized to it. When you are exposed to that allergic substance again, an allergic reaction can occur. Common causes of an anaphylactic reaction include: °· Medicines. °· Foods, especially peanuts, wheat, shellfish, milk, and eggs. °· Insect bites or stings. °· Blood products. °· Chemicals, such as dyes, latex, and contrast material used for imaging tests. °SYMPTOMS  °When an allergic reaction occurs, the body releases histamine and other substances. These substances cause symptoms such as tightening of the airway. Symptoms often develop within seconds or minutes of exposure. Symptoms may include: °· Skin rash or hives. °· Itching. °· Chest tightness. °· Swelling of the eyes, tongue, or lips. °· Trouble breathing or swallowing. °· Lightheadedness or fainting. °· Anxiety or confusion. °· Stomach pains, vomiting, or diarrhea. °· Nasal congestion. °· A fast or irregular heartbeat (palpitations). °DIAGNOSIS  °Diagnosis is based on your history of recent exposure to allergic substances, your symptoms, and a physical exam. Your caregiver may also perform blood or urine tests to confirm the diagnosis. °TREATMENT  °Epinephrine medicine is the main treatment for an anaphylactic reaction. Other medicines that may be used for treatment include antihistamines, steroids, and albuterol. In severe cases, fluids and medicine to support blood pressure may be given through an intravenous line (IV). Even if you improve after treatment, you need to be observed to make sure your condition does not get  worse. This may require a stay in the hospital. °HOME CARE INSTRUCTIONS  °· Wear a medical alert bracelet or necklace stating your allergy. °· You and your family must learn how to use an anaphylaxis kit or give an epinephrine injection to temporarily treat an emergency allergic reaction. Always carry your epinephrine injection or anaphylaxis kit with you. This can be lifesaving if you have a severe reaction. °· Do not drive or perform tasks after treatment until the medicines used to treat your reaction have worn off, or until your caregiver says it is okay. °· If you have hives or a rash: °¨ Take medicines as directed by your caregiver. °¨ You may use an over-the-counter antihistamine (diphenhydramine) as needed. °¨ Apply cold compresses to the skin or take baths in cool water. Avoid hot baths or showers. °SEEK MEDICAL CARE IF:  °· You develop symptoms of an allergic reaction to a new substance. Symptoms may start right away or minutes later. °· You develop a rash, hives, or itching. °· You develop new symptoms. °SEEK IMMEDIATE MEDICAL CARE IF:  °· You have swelling of the mouth, difficulty breathing, or wheezing. °· You have a tight feeling in your chest or throat. °· You develop hives, swelling, or itching all over your body. °· You develop severe vomiting or diarrhea. °· You feel faint or pass out. °This is an emergency. Use your epinephrine injection or anaphylaxis kit as you have been instructed. Call your local emergency services (911 in [place] Even if you improve after the injection, you need to be examined at a hospital emergency department. °MAKE SURE YOU:  °· Understand these instructions. °· Will watch your condition. °· Will get help right away if you are not   doing well or get worse. °  °This information is not intended to replace advice given to you by your health care provider. Make sure you discuss any questions you have with your health care provider. °  °Document Released: 06/03/2005 Document  Revised: 06/08/2013 Document Reviewed: 12/14/2014 °Elsevier Interactive Patient Education ©2016 Elsevier Inc[employer] °Epinephrine injection (Auto-injector) °What is this medicine? °EPINEPHRINE (ep i NEF rin) is used for the emergency treatment of severe allergic reactions. You should keep this medicine with you at all times. °This medicine may be used for other purposes; ask your health care provider or pharmacist if you have questions. °What should I tell my health care provider before I take this medicine? °They need to know if you have any of the following conditions: °-diabetes °-heart disease °-high blood pressure °-lung or breathing disease, like asthma °-Parkinson's disease °-thyroid disease °-an unusual or allergic reaction to epinephrine, sulfites, other medicines, foods, dyes, or preservatives °-pregnant or trying to get pregnant °-breast-feeding °How should I use this medicine? °This medicine is for injection into the outer thigh. Your doctor or health care professional will instruct you on the proper use of the device during an emergency. Read all directions carefully and make sure you understand them. Do not use more often than directed. °Talk to your pediatrician regarding the use of this medicine in children. Special care may be needed. This drug is commonly used in children. A special device is available for use in children. If you are giving this medicine to a young child, hold their leg firmly in place before and during the injection to prevent injury. °Overdosage: If you think you have taken too much of this medicine contact a poison control center or emergency room at once. °NOTE: This medicine is only for you. Do not share this medicine with others. °What if I miss a dose? °This does not apply. You should only use this medicine for an allergic reaction. °What may interact with this medicine? °This medicine is only used during an emergency. Significant drug interactions are not likely during emergency  use. °This list may not describe all possible interactions. Give your health care provider a list of all the medicines, herbs, non-prescription drugs, or dietary supplements you use. Also tell them if you smoke, drink alcohol, or use illegal drugs. Some items may interact with your medicine. °What should I watch for while using this medicine? °Keep this medicine ready for use in the case of a severe allergic reaction. Make sure that you have the phone number of your doctor or health care professional and local hospital ready. Remember to check the expiration date of your medicine regularly. You may need to have additional units of this medicine with you at work, school, or other places. Talk to your doctor or health care professional about your need for extra units. Some emergencies may require an additional dose. Check with your doctor or a health care professional before using an extra dose. °After use, go to the nearest hospital or call 911. Avoid physical activity. Make sure the treating health care professional knows you have received an injection of this medicine. You will receive additional instructions on what to do during and after use of this medicine before a medical emergency occurs. °What side effects may I notice from receiving this medicine? °Side effects that you should report to your doctor or health care professional as soon as possible: °-allergic reactions like skin rash, itching or hives, swelling of the face, lips, or tongue °-breathing   problems °-chest pain °-fast, irregular heartbeat °-pain, tingling, numbness in the hands or feet °-pain, redness, or irritation at site where injected °-vomiting °Side effects that usually do not require medical attention (report to your doctor or health care professional if they continue or are bothersome): °-anxious °-dizziness °-dry mouth °-headache °-increased sweating °-nausea °-unusually weak or tired °This list may not describe all possible side effects.  Call your doctor for medical advice about side effects. You may report side effects to FDA at 1-800-FDA-1088. °Where should I keep my medicine? °Keep out of the reach of children. °Store at room temperature between 15 and 30 degrees C (59 and 86 degrees F). Protect from light and heat. The solution should be clear in color. If the solution is discolored or contains particles it must be replaced. Throw away any unused medicine after the expiration date. Ask your doctor or pharmacist about proper disposal of the injector if it is expired or has been used. Always replace your auto-injector before it expires. °NOTE: This sheet is a summary. It may not cover all possible information. If you have questions about this medicine, talk to your doctor, pharmacist, or health care provider. °  °© 2016, Elsevier/Gold Standard. (2014-11-07 12:24:50) ° °

## 2015-04-16 NOTE — Discharge Summary (Addendum)
DISCHARGE SUMMARY  Melissa Dodson  MR#: [id number]  DOB:11-02-1970  Date of Admission: 04/12/2015 Date of Discharge: 04/16/2015  Attending Physician:MCCLUNG,JEFFREY T  Patient's EPP:[facility] NOT IN SYSTEM - pt is followed at [facility]:  none  Disposition: D/C home w/ family   Follow-up Appts:     Follow-up Information    Follow up with See your Primary Care MD at [facility] in 3-5 days.      Follow up with Your Primary Care Doctor will need to arranage for a referral to an Allergy/Immunology clinic in 3-4 weeks .     Tests Needing Follow-up: -extensive allergy testing / referral to allergy/immunology clinic indicated once course of antihistamine/steroid has been completed and cleared   Discharge Diagnoses: Anaphylactic reaction / Allergic reaction Depression Anxiety  Initial presentation: 44 y.o. F Hx Depression and Asthma who presented to the ER for the second time in a week with an apparent anaphylactic reaction. The patient's only new medication was nystatin. On either October 18 or 19th the patient went to her primary care physician for irritation around her nose. She was prescribed nystatin ointment. She used a very small amount of it twice a day. Within 24 hours she noticed some redness and itching on her hands. By Saturday a.m. she had developed lip swelling. She went to her local urgent care, and by the time she got there both of her lips were very swollen and she had developed difficulty breathing. She was treated at urgent care with epinephrine and discharged to home. She stopped using the nystatin ointment on Saturday. On Sunday she felt better, but on Monday the right half of her face developed swelling and redness. She came to the emergency department and was treated with epinephrine, Solu-Medrol, Pepcid, and IV fluids. Her facial swelling improved considerably and she was discharged on prednisone and Benadryl. On Tuesday she developed diarrhea and  dry heaves. Wednesday her hands and feet began to itch, her face swelled and suddenly her throat felt as though it were going to close and she could not breathe and she returned to the ER.  The patient's only other medication is Lexapro. She took Lexapro for approximately 6 months and stopped it in February 2016. She restarted Lexapro 20 mg approximately one month ago. She denies new soaps, lotions, perfumes, clothes soap. She denies known food allergies.   Addendum: At approximately 5:30 pm while in the ER, the patient called out for help. The patient felt as though her throat was closing up and she developed chest pain. The underside of her tongue was swelling, and she developed patchy redness in her face (as witnessed by the bedside RN, and ER PA). She received another epinephrine injection and her symptoms subsided.  Hospital Course:  Anaphylactic reaction / Allergic reaction -Believed initially to be a hypersensitive reaction to nystatin ointment, but sx recurred well after use of this medication was stopped -have now stopped lexapro -experienced recurrence of throat tightening 10/28 afternoon, necessitating another dose of epi, to which her sx responded - and again in the morning of 10/29 -no respiratory sx remainder of 10/29, or into 10/30 - pt strongly desires to be d/c home - she denies any respiratory sx whatsoever - no facial swelling or lip tingling  -differential is quite broad and includes: acquired C1 Esterase inhibitor deficiency, urticarial vasculitis, infection - all testing in hospital thus far negative/unrevealing  -all complement levels normal, as well as CRP and ESR  -most likely new-onset Urticaria  with Angioedema cause unknown -continue slow steroid taper and H1 as well as H2 blockade for 10 days  -re-prescribed home epi injection at time of d/c  -outpt referral to allergy/immunology clinic strongly suggested (unable to arrange prior to d/c as pt being d/c over a  weekend)  Depression Hold lexapro until further notice   Anxiety Appears well controlled at time of d/c     Medication List    STOP taking these medications        escitalopram 20 MG tablet  Commonly known as:  LEXAPRO     traMADol 50 MG tablet  Commonly known as:  ULTRAM      TAKE these medications        dextromethorphan-guaiFENesin 30-600 MG 12hr tablet  Commonly known as:  MUCINEX DM  Take 1 tablet by mouth 2 (two) times daily as needed for cough.     EPINEPHrine 0.3 mg/0.3 mL Soaj injection  Commonly known as:  EPI-PEN  Inject 0.3 mLs (0.3 mg total) into the muscle once.     famotidine 20 MG tablet  Commonly known as:  PEPCID  Take 1 tablet (20 mg total) by mouth 2 (two) times daily.     hydrOXYzine 25 MG tablet  Commonly known as:  ATARAX/VISTARIL  Take 1 tablet (25 mg total) by mouth every 6 (six) hours.     ibuprofen 200 MG tablet  Commonly known as:  ADVIL,MOTRIN  Take 400 mg by mouth every 6 (six) hours as needed for headache.     loratadine 10 MG tablet  Commonly known as:  CLARITIN  Take 1 tablet (10 mg total) by mouth daily.     ondansetron 4 MG tablet  Commonly known as:  ZOFRAN  Take 1 tablet (4 mg total) by mouth every 6 (six) hours as needed for nausea.     oxyCODONE-acetaminophen 5-325 MG tablet  Commonly known as:  PERCOCET/ROXICET  Take 1-2 tablets by mouth every 4 (four) hours as needed for severe pain.     predniSONE 20 MG tablet  Commonly known as:  DELTASONE  Take 3 tablets 2x a day on 10/30 and 10/31, then take 3 tablets a day on 11/1 and 11/2, then take 2 tablets a day on 11/3 and 11/4, then take 1 tablet a day on 11/5 and 11/6, then take 1/2 tablet a day on 11/7 and 11/8, then stop       Day of Discharge BP 162/85 mmHg  Pulse 70  Temp(Src) 98.1 F (36.7 C) (Oral)  Resp 21  Ht 5' 10.5" (1.791 m)  Wt 98.884 kg (218 lb)  BMI 30.83 kg/m2  SpO2 99%  Physical Exam: General: No acute respiratory distress - alert and  conversant - c/o some sinus congestion but no SOB  Lungs: Clear to auscultation bilaterally without wheezes or crackles - no stridor - no upper airway sounds whatsoever  Cardiovascular: Regular rate and rhythm without murmur gallop or rub  Abdomen: Nontender, nondistended, soft, bowel sounds positive, no rebound, no ascites, no appreciable mass Extremities: No significant cyanosis, clubbing, or edema bilateral lower extremities  Basic Metabolic Panel:  Recent Labs Lab 04/10/15 0857 04/12/15 1640 04/13/15 0329  NA 139 142 135  K 4.1 3.5 3.7  CL 107 111 105  CO2 24 21* 23  GLUCOSE 99 126* 129*  BUN '7 12 11  ' CREATININE 0.61 0.69 0.66  CALCIUM 8.7* 7.8* 8.1*   CBC:  Recent Labs Lab 04/10/15 0857 04/12/15 1640 04/13/15 0329  WBC  10.0 11.0* 10.5  NEUTROABS 5.2 10.2*  --   HGB 15.5* 12.9 13.3  HCT 45.5 39.5 40.1  MCV 89.7 90.4 90.7  PLT 376 296 322    Time spent in discharge (includes decision making & examination of pt): > 35 minutes  04/16/2015, 11:34 AM   Cherene Altes, MD [facility] Office  [phone] Pager [phone]  On-Call/Text Page:      [email or web address]      password [facility]

## 2015-04-16 NOTE — Progress Notes (Signed)
Discharged home with family in stable condition; awake, alert, and conversant.

## 2015-04-17 LAB — C1 ESTERASE INHIBITOR, FUNCTIONAL

## 2015-04-18 ENCOUNTER — Observation Stay (HOSPITAL_COMMUNITY)
Admission: EM | Admit: 2015-04-18 | Discharge: 2015-04-19 | Payer: Medicaid Other | Attending: Internal Medicine | Admitting: Internal Medicine

## 2015-04-18 ENCOUNTER — Encounter (HOSPITAL_COMMUNITY): Payer: Self-pay

## 2015-04-18 ENCOUNTER — Observation Stay (HOSPITAL_COMMUNITY): Payer: Medicaid Other

## 2015-04-18 ENCOUNTER — Emergency Department (HOSPITAL_COMMUNITY): Payer: Medicaid Other

## 2015-04-18 DIAGNOSIS — R079 Chest pain, unspecified: Secondary | ICD-10-CM | POA: Diagnosis not present

## 2015-04-18 DIAGNOSIS — Y9289 Other specified places as the place of occurrence of the external cause: Secondary | ICD-10-CM | POA: Diagnosis not present

## 2015-04-18 DIAGNOSIS — T490X5A Adverse effect of local antifungal, anti-infective and anti-inflammatory drugs, initial encounter: Secondary | ICD-10-CM | POA: Diagnosis not present

## 2015-04-18 DIAGNOSIS — R0902 Hypoxemia: Secondary | ICD-10-CM

## 2015-04-18 DIAGNOSIS — R918 Other nonspecific abnormal finding of lung field: Secondary | ICD-10-CM | POA: Insufficient documentation

## 2015-04-18 DIAGNOSIS — Z888 Allergy status to other drugs, medicaments and biological substances status: Secondary | ICD-10-CM | POA: Insufficient documentation

## 2015-04-18 DIAGNOSIS — R740 Nonspecific elevation of levels of transaminase and lactic acid dehydrogenase [LDH]: Secondary | ICD-10-CM | POA: Insufficient documentation

## 2015-04-18 DIAGNOSIS — J45909 Unspecified asthma, uncomplicated: Secondary | ICD-10-CM | POA: Insufficient documentation

## 2015-04-18 DIAGNOSIS — R05 Cough: Secondary | ICD-10-CM | POA: Diagnosis not present

## 2015-04-18 DIAGNOSIS — T782XXA Anaphylactic shock, unspecified, initial encounter: Secondary | ICD-10-CM | POA: Diagnosis present

## 2015-04-18 DIAGNOSIS — T886XXA Anaphylactic reaction due to adverse effect of correct drug or medicament properly administered, initial encounter: Principal | ICD-10-CM | POA: Insufficient documentation

## 2015-04-18 DIAGNOSIS — M25552 Pain in left hip: Secondary | ICD-10-CM | POA: Insufficient documentation

## 2015-04-18 DIAGNOSIS — J9601 Acute respiratory failure with hypoxia: Secondary | ICD-10-CM | POA: Diagnosis not present

## 2015-04-18 DIAGNOSIS — Z882 Allergy status to sulfonamides status: Secondary | ICD-10-CM | POA: Insufficient documentation

## 2015-04-18 DIAGNOSIS — F329 Major depressive disorder, single episode, unspecified: Secondary | ICD-10-CM | POA: Diagnosis not present

## 2015-04-18 DIAGNOSIS — T782XXD Anaphylactic shock, unspecified, subsequent encounter: Secondary | ICD-10-CM | POA: Diagnosis not present

## 2015-04-18 HISTORY — DX: Hypoxemia: R09.02

## 2015-04-18 HISTORY — DX: Headache: R51

## 2015-04-18 HISTORY — DX: Headache, unspecified: R51.9

## 2015-04-18 LAB — POCT I-STAT 3, ART BLOOD GAS (G3+)
Acid-Base Excess: 6 mmol/L — ABNORMAL HIGH (ref 0.0–2.0)
Bicarbonate: 31.7 mEq/L — ABNORMAL HIGH (ref 20.0–24.0)
O2 Saturation: 97 %
PCO2 ART: 47.2 mmHg — AB (ref 35.0–45.0)
PH ART: 7.435 (ref 7.350–7.450)
TCO2: 33 mmol/L (ref 0–100)
pO2, Arterial: 93 mmHg (ref 80.0–100.0)

## 2015-04-18 LAB — COMPREHENSIVE METABOLIC PANEL
ALBUMIN: 3.3 g/dL — AB (ref 3.5–5.0)
ALK PHOS: 53 U/L (ref 38–126)
ALT: 15 U/L (ref 14–54)
ANION GAP: 9 (ref 5–15)
AST: 19 U/L (ref 15–41)
BILIRUBIN TOTAL: 0.4 mg/dL (ref 0.3–1.2)
BUN: 17 mg/dL (ref 6–20)
CALCIUM: 8.5 mg/dL — AB (ref 8.9–10.3)
CO2: 29 mmol/L (ref 22–32)
CREATININE: 0.83 mg/dL (ref 0.44–1.00)
Chloride: 101 mmol/L (ref 101–111)
GFR calc Af Amer: >60 mL/min (ref >60)
GFR calc non Af Amer: >60 mL/min (ref >60)
GLUCOSE: 82 mg/dL (ref 65–99)
Potassium: 3.9 mmol/L (ref 3.5–5.1)
SODIUM: 139 mmol/L (ref 135–145)
TOTAL PROTEIN: 6.1 g/dL — AB (ref 6.5–8.1)

## 2015-04-18 LAB — CBC WITH DIFFERENTIAL/PLATELET
BASOS ABS: 0 10*3/uL (ref 0.0–0.1)
BASOS PCT: 0 %
EOS PCT: 1 %
Eosinophils Absolute: 0.1 10*3/uL (ref 0.0–0.7)
HEMATOCRIT: 49.6 % — AB (ref 36.0–46.0)
Hemoglobin: 16.5 g/dL — ABNORMAL HIGH (ref 12.0–15.0)
Lymphocytes Relative: 27 %
Lymphs Abs: 3.2 10*3/uL (ref 0.7–4.0)
MCH: 30.6 pg (ref 26.0–34.0)
MCHC: 33.3 g/dL (ref 30.0–36.0)
MCV: 92 fL (ref 78.0–100.0)
MONO ABS: 0.8 10*3/uL (ref 0.1–1.0)
MONOS PCT: 6 %
NEUTROS ABS: 8.1 10*3/uL — AB (ref 1.7–7.7)
Neutrophils Relative %: 66 %
PLATELETS: 272 10*3/uL (ref 150–400)
RBC: 5.39 MIL/uL — ABNORMAL HIGH (ref 3.87–5.11)
RDW: 13.1 % (ref 11.5–15.5)
WBC: 12.2 10*3/uL — ABNORMAL HIGH (ref 4.0–10.5)

## 2015-04-18 LAB — COMPLEMENT COMPONENT C1Q: C1Q COMPLEMENT PROTEIN CC1Q: 16.1 mg/dL (ref 11.8–24.4)

## 2015-04-18 LAB — SEDIMENTATION RATE: Sed Rate: 1 mm/h (ref 0–22)

## 2015-04-18 MED ORDER — HEPARIN SODIUM (PORCINE) 5000 UNIT/ML IJ SOLN
5000.0000 [IU] | Freq: Three times a day (TID) | INTRAMUSCULAR | Status: DC
  Administered 2015-04-19 (×3): 5000 [IU] via SUBCUTANEOUS
  Filled 2015-04-18 (×3): qty 1

## 2015-04-18 MED ORDER — HYDROMORPHONE HCL 1 MG/ML IJ SOLN
0.5000 mg | Freq: Once | INTRAMUSCULAR | Status: AC
  Administered 2015-04-18: 0.5 mg via INTRAVENOUS
  Filled 2015-04-18: qty 1

## 2015-04-18 MED ORDER — ONDANSETRON HCL 4 MG/2ML IJ SOLN
4.0000 mg | Freq: Once | INTRAMUSCULAR | Status: AC
  Administered 2015-04-18: 4 mg via INTRAVENOUS
  Filled 2015-04-18: qty 2

## 2015-04-18 MED ORDER — METHYLPREDNISOLONE SODIUM SUCC 125 MG IJ SOLR
60.0000 mg | Freq: Four times a day (QID) | INTRAMUSCULAR | Status: DC
  Administered 2015-04-19 (×4): 60 mg via INTRAVENOUS
  Filled 2015-04-18 (×5): qty 2

## 2015-04-18 MED ORDER — HYDROXYZINE HCL 25 MG PO TABS
25.0000 mg | ORAL_TABLET | Freq: Four times a day (QID) | ORAL | Status: DC
  Administered 2015-04-19 (×4): 25 mg via ORAL
  Filled 2015-04-18 (×4): qty 1

## 2015-04-18 MED ORDER — MORPHINE SULFATE (PF) 4 MG/ML IV SOLN
4.0000 mg | Freq: Once | INTRAVENOUS | Status: AC
  Administered 2015-04-18: 4 mg via INTRAVENOUS
  Filled 2015-04-18: qty 1

## 2015-04-18 MED ORDER — IOHEXOL 350 MG/ML SOLN
100.0000 mL | Freq: Once | INTRAVENOUS | Status: AC | PRN
  Administered 2015-04-18: 80 mL via INTRAVENOUS

## 2015-04-18 MED ORDER — MORPHINE SULFATE (PF) 4 MG/ML IV SOLN
6.0000 mg | Freq: Once | INTRAVENOUS | Status: DC
  Filled 2015-04-18: qty 2

## 2015-04-18 MED ORDER — FAMOTIDINE 20 MG PO TABS
20.0000 mg | ORAL_TABLET | Freq: Two times a day (BID) | ORAL | Status: DC
  Administered 2015-04-19 (×2): 20 mg via ORAL
  Filled 2015-04-18 (×2): qty 1

## 2015-04-18 MED ORDER — ALBUTEROL SULFATE (2.5 MG/3ML) 0.083% IN NEBU
2.5000 mg | INHALATION_SOLUTION | Freq: Four times a day (QID) | RESPIRATORY_TRACT | Status: DC
  Administered 2015-04-19: 2.5 mg via RESPIRATORY_TRACT
  Filled 2015-04-18: qty 3

## 2015-04-18 MED ORDER — SODIUM CHLORIDE 0.9 % IJ SOLN
3.0000 mL | Freq: Two times a day (BID) | INTRAMUSCULAR | Status: DC
  Administered 2015-04-19: 3 mL via INTRAVENOUS

## 2015-04-18 MED ORDER — SODIUM CHLORIDE 0.9 % IV BOLUS (SEPSIS)
1000.0000 mL | Freq: Once | INTRAVENOUS | Status: AC
Start: 2015-04-18 — End: 2015-04-19
  Administered 2015-04-18: 1000 mL via INTRAVENOUS

## 2015-04-18 MED ORDER — IPRATROPIUM BROMIDE 0.02 % IN SOLN
0.5000 mg | Freq: Four times a day (QID) | RESPIRATORY_TRACT | Status: DC
  Administered 2015-04-19: 0.5 mg via RESPIRATORY_TRACT
  Filled 2015-04-18: qty 2.5

## 2015-04-18 MED ORDER — SODIUM CHLORIDE 0.9 % IV SOLN
INTRAVENOUS | Status: DC
  Administered 2015-04-19: 01:00:00 via INTRAVENOUS

## 2015-04-18 MED ORDER — DIPHENHYDRAMINE HCL 50 MG/ML IJ SOLN
25.0000 mg | Freq: Once | INTRAMUSCULAR | Status: AC
  Administered 2015-04-18: 25 mg via INTRAVENOUS
  Filled 2015-04-18: qty 1

## 2015-04-18 MED ORDER — ACETAMINOPHEN 650 MG RE SUPP: 650.0000 mg | Freq: Four times a day (QID) | RECTAL | Status: DC | PRN

## 2015-04-18 MED ORDER — DOCUSATE SODIUM 100 MG PO CAPS
100.0000 mg | ORAL_CAPSULE | Freq: Two times a day (BID) | ORAL | Status: DC
  Administered 2015-04-19 (×2): 100 mg via ORAL
  Filled 2015-04-18 (×2): qty 1

## 2015-04-18 MED ORDER — ACETAMINOPHEN 325 MG PO TABS: 650.0000 mg | ORAL_TABLET | Freq: Four times a day (QID) | ORAL | Status: DC | PRN

## 2015-04-18 MED ORDER — LORATADINE 10 MG PO TABS
10.0000 mg | ORAL_TABLET | Freq: Every day | ORAL | Status: DC
  Administered 2015-04-19: 10 mg via ORAL
  Filled 2015-04-18: qty 1

## 2015-04-18 NOTE — ED Notes (Signed)
Pt transported to XR in no apparent distress

## 2015-04-18 NOTE — ED Notes (Signed)
Patient transported to CT scan . 

## 2015-04-18 NOTE — ED Notes (Signed)
Pt sent to waiting room next to [facility] first to wait for available room. Pt states she feels like her throat is closing but she is speaking clear, complete sentences and RR unlabored. Armen PickupEric Brewer at [facility] informed of patients complaints and this Rn informed nurse to let [facility] know if she feels worse or cant breathe. Minerva Areolaric stated he would monitor patient while she is in waiting room.

## 2015-04-18 NOTE — H&P (Signed)
[facility] History and Physical  Melissa CoonsJennifer E Dodson YQM:[id number] DOB: 1970-11-12 DOA: 04/18/2015  Referring physician: Gavin PoundJustin Brooten, MD PCP: PROVIDER NOT IN SYSTEM   Chief Complaint: Hypoxia  HPI: Melissa CoonsJennifer E Dodson is a 44 y.o. female with history of recent anaphylactic reaction to nystatin. Patient was admitted to the hospital for a reaction to nystatin. Patient had initially noted a reaction within 24 hours of first using the nystatin ointment. This went on for a couple of days and she had itching in her feet and hands. This was followed by facila swelling. Patient went to urgent care and received shots. Patient states my the next Monday she was seen in the ED and treated. Came back on a Wednesday an was admitted to the hospital. In the hospital she was treated with IV steroids antihistamines Pepcid and fluids. On the day of discharge 10/30 she was improved. It was also thought that the lexapro may possibly be contributing as symptoms were present after discontinuing the nystatin. Patient was supposed to follow up as an outpatient for allergy testing. This morning she woke to have a rash on her body. Patient states that she again noted the her face was swelling up. She took an epipen. She states that this time around she has noted chest pain and also has had joint pain. She states that her knees elbows and hips are hurting. Patient states she has shortness of breath and she was also noted to have hypoxia in the ED. No ABG done in the ED and noted to have a normal CXR. She does not smoke and she states she drinks socially/. She has not had any fevfers she has noted dizziness. She has noted edema of her legs. Patient has also noted a cough with some sputum production. She has a history of asthma as a child but as an adult.   Review of Systems:  Constitutional:  No weight loss, night sweats, Fevers, +chills, +fatigue.  HEENT:  + headaches, ear ache, nasal congestion, post nasal drip,    Cardio-vascular:  +chest pain, no Orthopnea, PND, swelling in lower extremities, anasarca, dizziness, palpitations  GI:  No heartburn, indigestion, abdominal pain, nausea, vomiting, diarrhea, change in bowel habits, loss of appetite  Resp:  +shortness of breath +productive cough, No coughing up of blood  Skin:  ++Rash GU:  no dysuria, change in color of urine Musculoskeletal:  + joint pain or swelling Psych:  No change in mood or affect. No depression or anxiety. No memory loss.   Past Medical History  Diagnosis Date  . Depression   . Asthma with exacerbation 09/01/2014   Past Surgical History  Procedure Laterality Date  . Knee surgery    . Abdominal hysterectomy    . Laparoscopic gastric banding N/A 2013    performed in [place]  . Cholecystectomy N/A 01/28/2014    Procedure: LAPAROSCOPIC CHOLECYSTECTOMY WITH INTRAOPERATIVE CHOLANGIOGRAM;  Surgeon: Valarie MerinoMatthew B Martin, MD;  Location: [facility];  Service: General;  Laterality: N/A;   Social History:  reports that she has never smoked. She has never used smokeless tobacco. She reports that she drinks alcohol. She reports that she does not use illicit drugs.  Allergies  Allergen Reactions  . Nystatin Anaphylaxis and Swelling    Patient broke out in rash on extremities, had swelling of the nasal passage, throat and lips. Reaction was increasing over the course of 1-3 days.  . Sulfa Antibiotics Hives    Family History  Problem Relation Age of Onset  .  Adopted: Yes  . Family history unknown: Yes     Prior to Admission medications   Medication Sig Start Date End Date Taking? Authorizing Provider  dextromethorphan-guaiFENesin (MUCINEX DM) 30-600 MG 12hr tablet Take 1 tablet by mouth 2 (two) times daily as needed for cough. 04/16/15  Yes Lonia Blood, MD  EPINEPHrine 0.3 mg/0.3 mL IJ SOAJ injection Inject 0.3 mLs (0.3 mg total) into the muscle once. 04/16/15  Yes Lonia Blood, MD  famotidine (PEPCID) 20 MG tablet Take 1  tablet (20 mg total) by mouth 2 (two) times daily. 04/16/15  Yes Lonia Blood, MD  hydrOXYzine (ATARAX/VISTARIL) 25 MG tablet Take 1 tablet (25 mg total) by mouth every 6 (six) hours. 04/16/15  Yes Lonia Blood, MD  ibuprofen (ADVIL,MOTRIN) 200 MG tablet Take 400 mg by mouth every 6 (six) hours as needed for headache.   Yes Historical Provider, MD  loratadine (CLARITIN) 10 MG tablet Take 1 tablet (10 mg total) by mouth daily. 04/16/15  Yes Lonia Blood, MD  ondansetron (ZOFRAN) 4 MG tablet Take 1 tablet (4 mg total) by mouth every 6 (six) hours as needed for nausea. 04/16/15  Yes Lonia Blood, MD  oxyCODONE-acetaminophen (PERCOCET/ROXICET) 5-325 MG tablet Take 1-2 tablets by mouth every 4 (four) hours as needed for severe pain. 04/16/15  Yes Lonia Blood, MD  predniSONE (DELTASONE) 20 MG tablet Take 3 tablets 2x a day on 10/30 and 10/31, then take 3 tablets a day on 11/1 and 11/2, then take 2 tablets a day on 11/3 and 11/4, then take 1 tablet a day on 11/5 and 11/6, then take 1/2 tablet a day on 11/7 and 11/8, then stop 04/16/15  Yes Lonia Blood, MD   Physical Exam: Filed Vitals:   04/18/15 2100 04/18/15 2115 04/18/15 2130 04/18/15 2145  BP: 129/67 122/62 140/77 144/70  Pulse: 75 71 74 81  Temp:      TempSrc:      Resp: SpO2: 93% 91% 93% 94%    Wt Readings from Last 3 Encounters:  04/15/15 98.884 kg (218 lb)  11/14/14 99.338 kg (219 lb)  09/05/14 113.535 kg (250 lb 4.8 oz)    General:  Appears calm and comfortable Eyes: PERRL, normal lids, irises & conjunctiva ENT: grossly normal hearing, lips & tongue Neck: no LAD, masses or thyromegaly Cardiovascular: RRR, no m/r/g. No LE edema. Respiratory: CTA bilaterally, no w/r/r. Normal respiratory effort. Abdomen: soft, ntnd Skin: maculopapular rash noted on the face trunk and back sparing of the legs and forearms Musculoskeletal: grossly normal tone BUE/BLE Psychiatric: grossly normal mood and  affect Neurologic: grossly non-focal.          Labs on Admission:  Basic Metabolic Panel:  Recent Labs Lab 04/12/15 1640 04/13/15 0329 04/18/15 1920  NA 142 135 139  K 3.5 3.7 3.9  CL 111 105 101  CO2 21* 23 29  GLUCOSE 126* 129* 82  BUN CREATININE 0.69 0.66 0.83  CALCIUM 7.8* 8.1* 8.5*   Liver Function Tests:  Recent Labs Lab 04/18/15 1920  AST 19  ALT 15  ALKPHOS 53  BILITOT 0.4  PROT 6.1*  ALBUMIN 3.3*   No results for input(s): LIPASE, AMYLASE in the last 168 hours. No results for input(s): AMMONIA in the last 168 hours. CBC:  Recent Labs Lab 04/12/15 1640 04/13/15 0329 04/18/15 1920  WBC 11.0* 10.5 12.2*  NEUTROABS 10.2*  --  8.1*  HGB 12.9 13.3 16.5*  HCT 39.5 40.1 49.6*  MCV 90.4 90.7 92.0  PLT 296 322 272   Cardiac Enzymes: No results for input(s): CKTOTAL, CKMB, CKMBINDEX, TROPONINI in the last 168 hours.  BNP (last 3 results) No results for input(s): BNP in the last 8760 hours.  ProBNP (last 3 results) No results for input(s): PROBNP in the last 8760 hours.  CBG: No results for input(s): GLUCAP in the last 168 hours.  Radiological Exams on Admission: Dg Chest 2 View  04/18/2015  CLINICAL DATA:  Shortness of breath, feels like throat is closing, rash, no improvement following Epi-pen use, discharged from hospital 2 days ago for allergic reaction, personal history of asthma EXAM: CHEST  2 VIEW COMPARISON:  None FINDINGS: Minimal enlargement of cardiac silhouette. Mediastinal contours and pulmonary vascularity normal. Azygos fissure noted. Central peribronchial thickening which may be related to patient's history of asthma. Lungs clear. No pulmonary infiltrate, pleural effusion or pneumothorax. Prior laparoscopic gastric banding. No acute osseous findings. IMPRESSION: Minimal enlargement of cardiac silhouette. Peribronchial thickening question related to history of asthma. No acute infiltrate Electronically Signed   By: Ulyses Southward M.D.    On: 04/18/2015 17:21      Assessment/Plan Active Problems:   Anaphylactic reaction   Hypoxia   Chronic asthma without complication   1. Hypoxia -will get an ABG now -will also get a CT angio of the chest -supplemental oxygen   2. Recent Anaphylactic reaction -will continue with antihistamines -will continue with pepcid -add IV steroids  3. Chronic Asthma without complication -duonebs as needed  4. Cough -have ordered CT of the chest -hold off on antibiotics but should have a low threshold to start as she was recently in the hospital if there is any abnormality on the CT scan     Code Status: full code (must indicate code status--if unknown or must be presumed, indicate so) DVT Prophylaxis:heparin Family Communication: none (indicate person spoken with, if applicable, with phone number if by telephone) Disposition Plan: home (indicate anticipated LOS)    [facility] A [facility] Pager [phone]

## 2015-04-18 NOTE — ED Provider Notes (Signed)
CSN: [id number]     Arrival date & time 04/18/15  1625 History   First MD Initiated Contact with Patient 04/18/15 1934     Chief Complaint  Patient presents with  . Shortness of Breath  . Allergic Reaction     (Consider location/radiation/quality/duration/timing/severity/associated sxs/prior Treatment) Patient is a 44 y.o. female presenting with rash. The history is provided by the patient and medical records.  Rash Location: chest, abdomen, face, back. Quality: itchiness and redness   Severity:  Severe Onset quality:  Gradual Duration:  10 days Timing:  Constant Progression:  Worsening Chronicity:  New Context: medications   Relieved by:  Nothing Worsened by:  Contact Ineffective treatments:  Antihistamines Associated symptoms: hoarse voice, joint pain and shortness of breath   Associated symptoms: no abdominal pain, no fever, no headaches, no myalgias, no periorbital edema and not wheezing     Past Medical History  Diagnosis Date  . Depression   . Asthma with exacerbation 09/01/2014   Past Surgical History  Procedure Laterality Date  . Knee surgery    . Abdominal hysterectomy    . Laparoscopic gastric banding N/A 2013    performed in [place]  . Cholecystectomy N/A 01/28/2014    Procedure: LAPAROSCOPIC CHOLECYSTECTOMY WITH INTRAOPERATIVE CHOLANGIOGRAM;  Surgeon: Valarie MerinoMatthew B Martin, MD;  Location: [facility];  Service: General;  Laterality: N/A;   Family History  Problem Relation Age of Onset  . Adopted: Yes  . Family history unknown: Yes   Social History  Substance Use Topics  . Smoking status: Never Smoker   . Smokeless tobacco: Never Used  . Alcohol Use: Yes     Comment: social on occ   OB History    Gravida Para Term Preterm AB TAB SAB Ectopic Multiple Living   2 2 1 1            Review of Systems  Constitutional: Negative for fever.  HENT: Positive for hoarse voice. Negative for facial swelling.   Respiratory: Positive for shortness of breath. Negative for  wheezing.   Cardiovascular: Negative for chest pain and leg swelling.  Gastrointestinal: Negative for abdominal pain.  Genitourinary: Negative for dysuria.  Musculoskeletal: Positive for arthralgias. Negative for myalgias, back pain and joint swelling.  Skin: Positive for rash.  Neurological: Negative for headaches.  Psychiatric/Behavioral: Negative for confusion.      Allergies  Nystatin and Sulfa antibiotics  Home Medications   Prior to Admission medications   Medication Sig Start Date End Date Taking? Authorizing Provider  dextromethorphan-guaiFENesin (MUCINEX DM) 30-600 MG 12hr tablet Take 1 tablet by mouth 2 (two) times daily as needed for cough. 04/16/15   Lonia BloodJeffrey T McClung, MD  EPINEPHrine 0.3 mg/0.3 mL IJ SOAJ injection Inject 0.3 mLs (0.3 mg total) into the muscle once. 04/16/15   Lonia BloodJeffrey T McClung, MD  famotidine (PEPCID) 20 MG tablet Take 1 tablet (20 mg total) by mouth 2 (two) times daily. 04/16/15   Lonia BloodJeffrey T McClung, MD  hydrOXYzine (ATARAX/VISTARIL) 25 MG tablet Take 1 tablet (25 mg total) by mouth every 6 (six) hours. 04/16/15   Lonia BloodJeffrey T McClung, MD  ibuprofen (ADVIL,MOTRIN) 200 MG tablet Take 400 mg by mouth every 6 (six) hours as needed for headache.    Historical Provider, MD  loratadine (CLARITIN) 10 MG tablet Take 1 tablet (10 mg total) by mouth daily. 04/16/15   Lonia BloodJeffrey T McClung, MD  ondansetron (ZOFRAN) 4 MG tablet Take 1 tablet (4 mg total) by mouth every 6 (six) hours as needed for  nausea. 04/16/15   Lonia Blood, MD  oxyCODONE-acetaminophen (PERCOCET/ROXICET) 5-325 MG tablet Take 1-2 tablets by mouth every 4 (four) hours as needed for severe pain. 04/16/15   Lonia Blood, MD  predniSONE (DELTASONE) 20 MG tablet Take 3 tablets 2x a day on 10/30 and 10/31, then take 3 tablets a day on 11/1 and 11/2, then take 2 tablets a day on 11/3 and 11/4, then take 1 tablet a day on 11/5 and 11/6, then take 1/2 tablet a day on 11/7 and 11/8, then stop 04/16/15    Lonia Blood, MD   BP 145/69 mmHg  Pulse 83  Temp(Src) 97.9 F (36.6 C) (Oral)  Resp 18  SpO2 94% Physical Exam  Constitutional: She is oriented to person, place, and time. She appears well-developed and well-nourished. No distress.  HENT:  Head: Normocephalic and atraumatic.  Right Ear: External ear normal.  Left Ear: External ear normal.  Nose: Nose normal.  Mouth/Throat: Posterior oropharyngeal erythema present. No oropharyngeal exudate.  Eyes: EOM are normal. Pupils are equal, round, and reactive to light. Right eye exhibits no discharge. Left eye exhibits no discharge. Right conjunctiva is injected. Left conjunctiva is injected. No scleral icterus.  Neck: Normal range of motion. Neck supple. No JVD present. No tracheal deviation present. No thyromegaly present.  Cardiovascular: Normal rate, regular rhythm and intact distal pulses.   Pulmonary/Chest: Effort normal and breath sounds normal. No stridor. No respiratory distress. She has no wheezes. She has no rales. She exhibits no tenderness.  Abdominal: Soft. She exhibits no distension. There is no tenderness.  Musculoskeletal: Normal range of motion. She exhibits no edema or tenderness.  Lymphadenopathy:    She has no cervical adenopathy.  Neurological: She is alert and oriented to person, place, and time. She displays no atrophy and no tremor. She exhibits normal muscle tone. She displays no seizure activity. GCS eye subscore is 4. GCS verbal subscore is 5. GCS motor subscore is 6.  Skin: Skin is warm and dry. Rash noted. No petechiae and no purpura noted. Rash is macular. Rash is not urticarial. She is not diaphoretic. No erythema. No pallor.  Psychiatric: She has a normal mood and affect. Her behavior is normal. Judgment and thought content normal.  Nursing note and vitals reviewed.   ED Course  Procedures (including critical care time) Labs Review Labs Reviewed - No data to display  Imaging Review Dg Chest 2  View  04/18/2015  CLINICAL DATA:  Shortness of breath, feels like throat is closing, rash, no improvement following Epi-pen use, discharged from hospital 2 days ago for allergic reaction, personal history of asthma EXAM: CHEST  2 VIEW COMPARISON:  None FINDINGS: Minimal enlargement of cardiac silhouette. Mediastinal contours and pulmonary vascularity normal. Azygos fissure noted. Central peribronchial thickening which may be related to patient's history of asthma. Lungs clear. No pulmonary infiltrate, pleural effusion or pneumothorax. Prior laparoscopic gastric banding. No acute osseous findings. IMPRESSION: Minimal enlargement of cardiac silhouette. Peribronchial thickening question related to history of asthma. No acute infiltrate Electronically Signed   By: Ulyses Southward M.D.   On: 04/18/2015 17:21   I have personally reviewed and evaluated these images and lab results as part of my medical decision-making.   EKG Interpretation None      MDM   Final diagnoses:  None    Patient presenting for rash, shortness of breath, and itching. Patient was recently discharged following hospitalization for anaphylaxis. Patient presumed to be having prolonged reaction to nystatin.  Patient had been responsive to Benadryl and epinephrine for oral and facial swelling. Patient has been on steroids since discharge. Rash has continued  to increase. Patient also now endorsing some joint pain. No swelling noted to her joints. Rash does appear to blanch and is not purpuric but is diffuse across her abdomen chest and face, also back. Spares lower extremities. Patient has a O2 requirement. Noted to have saturations in the high 80s with good waveform on room air. No history of prior lung disease, patient is a nonsmoker. Based on prior drug exposure this could be a drug-related rash, less likely anaphylaxis as far out as its been 10 days. Also possible serum sickness with associated arthralgias. Patient is not febrile. No  apparent respiratory distress as far as oral pharyngeal swelling is concerned, but could have a pneumonitis associated with a drug reaction. Patient was given Benadryl and pain medication for her itching. She already took steroids today. No medication at this time for epinephrine as this is been going on for several hours, and appears in laboratory workup obtained to evaluate for underlying consistent with anaphylaxis. No desNo significant laboratory abnormalities to account for patient's symptoms. In any case needs admission for her O2 requirement, and further evaluation for rash of unknown etiology with concerning findings.  No signs of TEN or SJS.  Patient care was discussed with my attending, Dr. Verdie Mosher.   Gavin Pound, MD 04/19/15 1610  Lavera Guise, MD 04/19/15 [phone]

## 2015-04-18 NOTE — ED Notes (Signed)
Patient transported to CT 

## 2015-04-18 NOTE — ED Notes (Addendum)
Pt reports she is here for allergic reaction and is having some shortness of breath and red rash to face and shoulders. She states she was recently discharged from the ICU here due to allergic reaction and states she feels as though her symptoms are coming back. Airway patent, RR e/u, skin warm dry, NAD and pt able to speak in clear complete sentences. Pt unsure of what she is allergic to but thinks it may be Nystatin.

## 2015-04-19 ENCOUNTER — Encounter (HOSPITAL_COMMUNITY): Payer: Self-pay | Admitting: General Practice

## 2015-04-19 DIAGNOSIS — T7840XD Allergy, unspecified, subsequent encounter: Secondary | ICD-10-CM

## 2015-04-19 DIAGNOSIS — J9601 Acute respiratory failure with hypoxia: Secondary | ICD-10-CM

## 2015-04-19 DIAGNOSIS — R0902 Hypoxemia: Secondary | ICD-10-CM | POA: Diagnosis present

## 2015-04-19 DIAGNOSIS — J45909 Unspecified asthma, uncomplicated: Secondary | ICD-10-CM | POA: Diagnosis not present

## 2015-04-19 DIAGNOSIS — M2550 Pain in unspecified joint: Secondary | ICD-10-CM | POA: Diagnosis not present

## 2015-04-19 DIAGNOSIS — T886XXA Anaphylactic reaction due to adverse effect of correct drug or medicament properly administered, initial encounter: Secondary | ICD-10-CM | POA: Diagnosis not present

## 2015-04-19 DIAGNOSIS — R7989 Other specified abnormal findings of blood chemistry: Secondary | ICD-10-CM

## 2015-04-19 DIAGNOSIS — R21 Rash and other nonspecific skin eruption: Secondary | ICD-10-CM

## 2015-04-19 DIAGNOSIS — R079 Chest pain, unspecified: Secondary | ICD-10-CM | POA: Diagnosis not present

## 2015-04-19 DIAGNOSIS — F329 Major depressive disorder, single episode, unspecified: Secondary | ICD-10-CM | POA: Insufficient documentation

## 2015-04-19 DIAGNOSIS — R918 Other nonspecific abnormal finding of lung field: Secondary | ICD-10-CM | POA: Diagnosis not present

## 2015-04-19 DIAGNOSIS — T490X5A Adverse effect of local antifungal, anti-infective and anti-inflammatory drugs, initial encounter: Secondary | ICD-10-CM | POA: Diagnosis not present

## 2015-04-19 DIAGNOSIS — M25552 Pain in left hip: Secondary | ICD-10-CM | POA: Diagnosis not present

## 2015-04-19 DIAGNOSIS — Z882 Allergy status to sulfonamides status: Secondary | ICD-10-CM | POA: Diagnosis not present

## 2015-04-19 DIAGNOSIS — R05 Cough: Secondary | ICD-10-CM | POA: Diagnosis not present

## 2015-04-19 DIAGNOSIS — R740 Nonspecific elevation of levels of transaminase and lactic acid dehydrogenase [LDH]: Secondary | ICD-10-CM | POA: Diagnosis not present

## 2015-04-19 DIAGNOSIS — Y9289 Other specified places as the place of occurrence of the external cause: Secondary | ICD-10-CM | POA: Diagnosis not present

## 2015-04-19 DIAGNOSIS — Z888 Allergy status to other drugs, medicaments and biological substances status: Secondary | ICD-10-CM | POA: Diagnosis not present

## 2015-04-19 LAB — COMPREHENSIVE METABOLIC PANEL
ALBUMIN: 2.8 g/dL — AB (ref 3.5–5.0)
ALT: 273 U/L — AB (ref 14–54)
AST: 412 U/L — AB (ref 15–41)
Alkaline Phosphatase: 127 U/L — ABNORMAL HIGH (ref 38–126)
Anion gap: 10 (ref 5–15)
BUN: 15 mg/dL (ref 6–20)
CHLORIDE: 100 mmol/L — AB (ref 101–111)
CO2: 28 mmol/L (ref 22–32)
CREATININE: 0.97 mg/dL (ref 0.44–1.00)
Calcium: 8.4 mg/dL — ABNORMAL LOW (ref 8.9–10.3)
GFR calc Af Amer: >60 mL/min (ref >60)
GFR calc non Af Amer: >60 mL/min (ref >60)
GLUCOSE: 165 mg/dL — AB (ref 65–99)
POTASSIUM: 4.3 mmol/L (ref 3.5–5.1)
SODIUM: 138 mmol/L (ref 135–145)
Total Bilirubin: 0.7 mg/dL (ref 0.3–1.2)
Total Protein: 4.7 g/dL — ABNORMAL LOW (ref 6.5–8.1)

## 2015-04-19 LAB — APTT: APTT: 27 s (ref 24–37)

## 2015-04-19 LAB — GLUCOSE, CAPILLARY: Glucose-Capillary: 119 mg/dL — ABNORMAL HIGH (ref 65–99)

## 2015-04-19 LAB — CBC
HCT: 44.3 % (ref 36.0–46.0)
Hemoglobin: 14.6 g/dL (ref 12.0–15.0)
MCH: 30.4 pg (ref 26.0–34.0)
MCHC: 33 g/dL (ref 30.0–36.0)
MCV: 92.1 fL (ref 78.0–100.0)
PLATELETS: 271 10*3/uL (ref 150–400)
RBC: 4.81 MIL/uL (ref 3.87–5.11)
RDW: 13.3 % (ref 11.5–15.5)
WBC: 12.5 10*3/uL — AB (ref 4.0–10.5)

## 2015-04-19 LAB — PROTIME-INR
INR: 1.07 (ref 0.00–1.49)
Prothrombin Time: 14.1 seconds (ref 11.6–15.2)

## 2015-04-19 LAB — SEDIMENTATION RATE: Sed Rate: 2 mm/hr (ref 0–22)

## 2015-04-19 LAB — CK: CK TOTAL: 13 U/L — AB (ref 38–234)

## 2015-04-19 LAB — TSH: TSH: 2.162 u[IU]/mL (ref 0.350–4.500)

## 2015-04-19 MED ORDER — INFLUENZA VAC SPLIT QUAD 0.5 ML IM SUSY: 0.5000 mL | PREFILLED_SYRINGE | INTRAMUSCULAR | Status: DC

## 2015-04-19 MED ORDER — ZOLPIDEM TARTRATE 5 MG PO TABS
5.0000 mg | ORAL_TABLET | Freq: Once | ORAL | Status: AC
  Administered 2015-04-19: 5 mg via ORAL
  Filled 2015-04-19: qty 1

## 2015-04-19 MED ORDER — DIPHENHYDRAMINE HCL 50 MG/ML IJ SOLN
12.5000 mg | Freq: Once | INTRAMUSCULAR | Status: AC
  Administered 2015-04-19: 12.5 mg via INTRAVENOUS

## 2015-04-19 MED ORDER — DIPHENHYDRAMINE HCL 50 MG/ML IJ SOLN: 12.5000 mg | Freq: Three times a day (TID) | INTRAMUSCULAR | Status: DC | PRN

## 2015-04-19 MED ORDER — HYDROCODONE-ACETAMINOPHEN 5-325 MG PO TABS
1.0000 | ORAL_TABLET | Freq: Four times a day (QID) | ORAL | Status: DC | PRN
  Administered 2015-04-19: 1 via ORAL
  Filled 2015-04-19: qty 1

## 2015-04-19 MED ORDER — DIPHENHYDRAMINE HCL 50 MG/ML IJ SOLN
12.5000 mg | Freq: Three times a day (TID) | INTRAMUSCULAR | Status: DC | PRN
  Administered 2015-04-19 (×2): 12.5 mg via INTRAVENOUS
  Filled 2015-04-19 (×3): qty 1

## 2015-04-19 MED ORDER — CAMPHOR-MENTHOL 0.5-0.5 % EX LOTN
TOPICAL_LOTION | CUTANEOUS | Status: DC | PRN
  Administered 2015-04-19: 03:00:00 via TOPICAL
  Filled 2015-04-19: qty 222

## 2015-04-19 MED ORDER — PNEUMOCOCCAL VAC POLYVALENT 25 MCG/0.5ML IJ INJ: 0.5000 mL | INJECTION | INTRAMUSCULAR | Status: DC

## 2015-04-19 MED ORDER — ALBUTEROL SULFATE (2.5 MG/3ML) 0.083% IN NEBU: 2.5000 mg | INHALATION_SOLUTION | RESPIRATORY_TRACT | Status: DC | PRN

## 2015-04-19 MED ORDER — ONDANSETRON HCL 4 MG/2ML IJ SOLN: 4.0000 mg | Freq: Three times a day (TID) | INTRAMUSCULAR | Status: DC | PRN

## 2015-04-19 MED ORDER — NAPROXEN 250 MG PO TABS
250.0000 mg | ORAL_TABLET | Freq: Two times a day (BID) | ORAL | Status: DC
  Administered 2015-04-19: 250 mg via ORAL
  Filled 2015-04-19 (×2): qty 1

## 2015-04-19 MED ORDER — NAPROXEN 250 MG PO TABS: 250.0000 mg | ORAL_TABLET | Freq: Two times a day (BID) | ORAL | Status: DC

## 2015-04-19 MED ORDER — HYDROMORPHONE HCL 1 MG/ML IJ SOLN
1.0000 mg | INTRAMUSCULAR | Status: DC | PRN
  Administered 2015-04-19 (×5): 1 mg via INTRAVENOUS
  Filled 2015-04-19 (×5): qty 1

## 2015-04-19 MED ORDER — METHYLPREDNISOLONE SODIUM SUCC 125 MG IJ SOLR: 60.0000 mg | Freq: Four times a day (QID) | INTRAMUSCULAR | Status: DC

## 2015-04-19 NOTE — Progress Notes (Signed)
PROGRESS NOTE    DENI BERTI KGY:[id number] DOB: 06-May-1971 DOA: 04/18/2015 PCP: PROVIDER NOT IN SYSTEM  HPI/Brief narrative 44 year old female patient with history of asthma, depression, recent hospitalization 04/12/15-04/16/15 for anaphylactic reaction, readmitted to [facility] on 04/18/15 with with complaints of skin rash, face swelling, chest pain, joint pains, dyspnea and hypoxia. Her history dates back to approximately 2 weeks ago when she was treated for irritation around the nose with nystatin ointment. 24 hours later she had redness and itching of hands and 4-5 days later she developed significant lip swelling. Seen at urgent care center and treated with epinephrine and discharged home. Nystatin stopped and not taken since. Recurrence of facial swelling 2 days later, seen in ED and treated with epinephrine, Solu-Medrol, Pepcid and IV fluids and then discharged on prednisone and Benadryl. Had recurrence again of itching, facial swelling and sensation of throat beginning to close and dyspnea, leading to hospitalization: Her presentation was felt to be initially related to nystatin ointment which was stopped. She had been on Lexapro for a month which was also discontinued. Etiology of her presumed allergic reaction was not entirely clear. Extensive workup was unremarkable. Patient insisted on going home over the weekend and was discharged with recommendations for outpatient allergy/immunology consultation and she was discharged on prednisone taper and H1/H2 blockers.   Assessment/Plan:  ? Serum Sickness/Allergic process - Course over the last 14 days as outlined in summary above. - Nystatin and Lexapro have been discontinued several days ago. She denies eating anything unusual or exposure to any new products related to personal care i.e. soaps, lotions etc. - She has had several courses of treatment with epinephrine, steroids, Benadryl and H1/H2 blockers with recurrence of symptoms. -  Arthralgia is new. - Extensive workup thus far is unremarkable (mononucleosis screen: Negative, SSB, IgG <0.2, SSA, IgG <0.2, EBV PCR negative, acute hepatitis panel-negative, ANA negative, ds DNA <1, C2-pending, C1 esterase inhibitor >100, C3: 91, C4: 22, CH 50:52, C1q: 16.1, C1 esterase inhibitor panel: 26, ESR 0 & 1, CRP <0.5, Lyme disease PCR negative. - Continue IV Solu-Medrol, Pepcid and hydroxyzine for now - Add NSAIDs (naproxen) for arthralgia. - Clinically better: Dyspnea resolved, skin rash improved, arthralgia persists, no facial swelling - Have placed all into a dermatology practice to discuss case. Unfortunately no dermatology or rheumatology service available in hospital. - Mild transaminitis of unclear etiology. Will check CK.  Acute respiratory failure with hypoxia - Unclear etiology - CTA chest negative for PE. Suggestive of atelectasis. Incentive spirometry. Monitor - Seems to have resolved.  Mild transaminitis - Unclear etiology.? Related to presenting problem above. No GI symptoms reported. Follow LFTs in a.m. Check CK.   Chronic asthma - Stable without clinical bronchospasm    DVT prophylaxis: Subcutaneous heparin Code Status: Full Family Communication: None at bedside Disposition Plan: DC home when medically stable.   Consultants:  None  Procedures:  None  Antibiotics:  None   Subjective: Dyspnea resolved, skin rash improved, arthralgia persists in bilateral ankles, knees and hips & no facial swelling  Objective: Filed Vitals:   04/18/15 2340 04/19/15 0639 04/19/15 0739 04/19/15 1301  BP: 141/78 128/76  134/65  Pulse: 78 65  87  Temp: 98.5 F (36.9 C) 98 F (36.7 C)  98.1 F (36.7 C)  TempSrc: Oral Oral  Oral  Resp: '20 20  20  ' Height: '5\' 11"'  (1.803 m)     Weight: 105.933 kg (233 lb 8.6 oz)     SpO2:  95% 97%  92%    Intake/Output Summary (Last 24 hours) at 04/19/15 1323 Last data filed at 04/19/15 1304  Gross per 24 hour  Intake  1076.67 ml  Output   2550 ml  Net -1473.33 ml   Filed Weights   04/18/15 2340  Weight: 105.933 kg (233 lb 8.6 oz)     Exam: Patient was examined with a female nursing tech as chaperone in the room. General exam: Pleasant young female lying comfortably in bed Respiratory system: Clear. No increased work of breathing. Cardiovascular system: S1 & S2 heard, RRR. No JVD, murmurs, gallops, clicks or pedal edema. Telemetry: Sinus rhythm Gastrointestinal system: Abdomen is nondistended, soft and nontender. Normal bowel sounds heard. Central nervous system: Alert and oriented. No focal neurological deficits. Extremities: Symmetric 5 x 5 power. No acute changes in joints-ankle, knee or hips. Skin: Fine discrete and fading erythematous macular rash over trunk anteriorly and posteriorly but none seen on extremities or face at this time.   Data Reviewed: Basic Metabolic Panel:  Recent Labs Lab 04/12/15 1640 04/13/15 0329 04/18/15 1920 04/19/15 0358  NA 142 135 139 138  K 3.5 3.7 3.9 4.3  CL 111 105 101 100*  CO2 21* '23 29 28  ' GLUCOSE 126* 129* 82 165*  BUN '12 11 17 15  ' CREATININE 0.69 0.66 0.83 0.97  CALCIUM 7.8* 8.1* 8.5* 8.4*   Liver Function Tests:  Recent Labs Lab 04/18/15 1920 04/19/15 0358  AST 19 412*  ALT 15 273*  ALKPHOS 53 127*  BILITOT 0.4 0.7  PROT 6.1* 4.7*  ALBUMIN 3.3* 2.8*   No results for input(s): LIPASE, AMYLASE in the last 168 hours. No results for input(s): AMMONIA in the last 168 hours. CBC:  Recent Labs Lab 04/12/15 1640 04/13/15 0329 04/18/15 1920 04/19/15 0358  WBC 11.0* 10.5 12.2* 12.5*  NEUTROABS 10.2*  --  8.1*  --   HGB 12.9 13.3 16.5* 14.6  HCT 39.5 40.1 49.6* 44.3  MCV 90.4 90.7 92.0 92.1  PLT 296 322 272 271   Cardiac Enzymes: No results for input(s): CKTOTAL, CKMB, CKMBINDEX, TROPONINI in the last 168 hours. BNP (last 3 results) No results for input(s): PROBNP in the last 8760 hours. CBG:  Recent Labs Lab 04/19/15 0642    GLUCAP 119*    No results found for this or any previous visit (from the past 240 hour(s)).         Studies: Dg Chest 2 View  04/18/2015  CLINICAL DATA:  Shortness of breath, feels like throat is closing, rash, no improvement following Epi-pen use, discharged from hospital 2 days ago for allergic reaction, personal history of asthma EXAM: CHEST  2 VIEW COMPARISON:  None FINDINGS: Minimal enlargement of cardiac silhouette. Mediastinal contours and pulmonary vascularity normal. Azygos fissure noted. Central peribronchial thickening which may be related to patient's history of asthma. Lungs clear. No pulmonary infiltrate, pleural effusion or pneumothorax. Prior laparoscopic gastric banding. No acute osseous findings. IMPRESSION: Minimal enlargement of cardiac silhouette. Peribronchial thickening question related to history of asthma. No acute infiltrate Electronically Signed   By: Lavonia Dana M.D.   On: 04/18/2015 17:21   Ct Angio Chest Pe W/cm &/or Wo Cm  04/18/2015  CLINICAL DATA:  Hypoxia. Cough. Recently discharged, similar symptoms. EXAM: CT ANGIOGRAPHY CHEST WITH CONTRAST TECHNIQUE: Multidetector CT imaging of the chest was performed using the standard protocol during bolus administration of intravenous contrast. Multiplanar CT image reconstructions and MIPs were obtained to evaluate the vascular anatomy. CONTRAST:  19m OMNIPAQUE IOHEXOL  350 MG/ML SOLN COMPARISON:  Radiographs 04/18/2015 FINDINGS: Cardiovascular: There is good opacification of the pulmonary arteries. There is no pulmonary embolism. The thoracic aorta is normal in caliber and intact. Lungs: Linear atelectatic appearing opacities are present in the posterior lower lobes. The lungs are otherwise clear. Central airways: Patent Effusions: None Lymphadenopathy: None Esophagus: Unremarkable Upper abdomen: Incidentally noted lap band.  Prior cholecystectomy. Musculoskeletal: No significant abnormality Review of the MIP images confirms  the above findings. IMPRESSION: Negative for acute pulmonary embolism. Mild atelectatic appearing opacities are present in the posterior lower lobe bilaterally. Electronically Signed   By: Andreas Newport M.D.   On: 04/18/2015 23:27        Scheduled Meds: . albuterol  2.5 mg Nebulization Q6H  . docusate sodium  100 mg Oral BID  . famotidine  20 mg Oral BID  . heparin  5,000 Units Subcutaneous 3 times per day  . hydrOXYzine  25 mg Oral 4 times per day  . [START ON 04/20/2015] Influenza vac split quadrivalent PF  0.5 mL Intramuscular Tomorrow-1000  . ipratropium  0.5 mg Nebulization Q6H  . loratadine  10 mg Oral Daily  . methylPREDNISolone (SOLU-MEDROL) injection  60 mg Intravenous Q6H  . naproxen  250 mg Oral BID WC  . [START ON 04/20/2015] pneumococcal 23 valent vaccine  0.5 mL Intramuscular Tomorrow-1000  . sodium chloride  3 mL Intravenous Q12H   Continuous Infusions: . sodium chloride 50 mL/hr at 04/19/15 1300    Active Problems:   Anaphylactic reaction   Hypoxia   Chronic asthma without complication    Time spent: 40 minutes.    Vernell Leep, MD, FACP, FHM. [facility] Pager [phone]  If 7PM-7AM, please contact night-coverage [email or web address] Password [email or web address] 04/19/2015, 1:23 PM

## 2015-04-19 NOTE — Discharge Summary (Signed)
Physician Discharge Summary  Melissa Dodson XBM:[id number] DOB: 06-12-1971 DOA: 04/18/2015  PCP: PROVIDER NOT IN SYSTEM  Admit date: 04/18/2015 Discharge date: 04/19/2015  Time spent: Greater than 30 minutes  Recommendations for Outpatient Follow-up:  1. Patient is being transferred to the [facility] for further evaluation of complex medical problem. She will need interdisciplinary consultations including and not limited to dermatology, rheumatology and immunology.  Discharge Diagnoses:  Active Problems:   Anaphylactic reaction   Hypoxia   Chronic asthma without complication   Discharge Condition: Improved & Stable  Diet recommendation: Heart healthy  Filed Weights   04/18/15 2340  Weight: 105.933 kg (233 lb 8.6 oz)    History of present illness:  44 year old female patient with history of asthma, depression, recent hospitalization 04/12/15-04/16/15 for anaphylactic reaction, readmitted to [facility] on 04/18/15 with with complaints of skin rash, face swelling, chest pain, joint pains, dyspnea and hypoxia. Her history dates back to approximately 2 weeks ago when she was treated for irritation around the nose with nystatin ointment. 24 hours later she had redness and itching of hands and 4-5 days later she developed significant lip swelling. Seen at urgent care center and treated with epinephrine and discharged home. Nystatin stopped and not taken since. Recurrence of facial swelling 2 days later, seen in ED and treated with epinephrine, Solu-Medrol, Pepcid and IV fluids and then discharged on prednisone and Benadryl. Had recurrence again of itching, facial swelling and sensation of throat beginning to close and dyspnea, leading to hospitalization: Her presentation was felt to be initially related to nystatin ointment which was stopped. She had been on Lexapro for a month which was also discontinued. Etiology of her presumed allergic reaction was not entirely clear.  Extensive workup was unremarkable. Patient insisted on going home over the weekend and was discharged with recommendations for outpatient allergy/immunology consultation and she was discharged on prednisone taper and H1/H2 blockers.  Hospital Course:   ? Serum Sickness/Allergic process - Course over the last 14 days as outlined in summary above. - Nystatin and Lexapro have been discontinued several days ago. She denies eating anything unusual or exposure to any new products related to personal care i.e. soaps, lotions etc. - She has had several courses of treatment with epinephrine, steroids, Benadryl and H1/H2 blockers with recurrence of symptoms. - Arthralgia is new. - Extensive workup thus far is unremarkable (mononucleosis screen: Negative, SSB, IgG <0.2, SSA, IgG <0.2, EBV PCR negative, acute hepatitis panel-negative, ANA negative, ds DNA <1, C2-pending, C1 esterase inhibitor >100, C3: 91, C4: 22, CH 50:52, C1q: 16.1, C1 esterase inhibitor panel: 26, ESR 0 & 1, CRP <0.5, Lyme disease PCR negative. - Continue IV Solu-Medrol, Pepcid and hydroxyzine for now - Add NSAIDs (naproxen) for arthralgia. - Clinically better: Dyspnea resolved, skin rash improved, arthralgia persists, no facial swelling - Mild transaminitis of unclear etiology. CK normal. Urine pregnancy test requested-not yet done. - Due to recurrent and complex nature of her presentation for the last 2 weeks which has required multiple visits to urgent care, ED and hospitalizations, she will need multidisciplinary input from dermatology/rheumatology/immunology-none of which are available at this facility and hence discussed with hospitalist and the rheumatologist at [facility] who have kindly accepted patient in transfer for further evaluation.  Acute respiratory failure with hypoxia - Unclear etiology - CTA chest negative for PE. Suggestive of atelectasis. Incentive spirometry. Monitor - Seems to have resolved.  Mild  transaminitis - Unclear etiology.? Related to presenting problem above. No  GI symptoms reported. Follow LFTs in a.m. CK normal.   Chronic asthma - Stable without clinical bronchospasm    Consultants:  None  Procedures:  None  Antibiotics:  None   Discharge Exam:  Complaints:  Dyspnea resolved, skin rash improved, arthralgia persists in bilateral ankles, knees and hips & no facial swelling  Filed Vitals:   04/18/15 2340 04/19/15 0639 04/19/15 0739 04/19/15 1301  BP: 141/78 128/76  134/65  Pulse: 78 65  87  Temp: 98.5 F (36.9 C) 98 F (36.7 C)  98.1 F (36.7 C)  TempSrc: Oral Oral  Oral  Resp: '20 20  20  ' Height: '5\' 11"'  (1.803 m)     Weight: 105.933 kg (233 lb 8.6 oz)     SpO2:  95% 97% 92%    Patient was examined with a female nursing tech as chaperone in the room. General exam: Pleasant young female lying comfortably in bed Respiratory system: Clear. No increased work of breathing. Cardiovascular system: S1 & S2 heard, RRR. No JVD, murmurs, gallops, clicks or pedal edema. Telemetry: Sinus rhythm Gastrointestinal system: Abdomen is nondistended, soft and nontender. Normal bowel sounds heard. Central nervous system: Alert and oriented. No focal neurological deficits. Extremities: Symmetric 5 x 5 power. No acute changes in joints-ankle, knee or hips. Skin: Fine discrete and fading erythematous macular rash over trunk anteriorly and posteriorly but none seen on extremities or face at this time.  Discharge Instructions      Discharge Instructions    Call MD for:  difficulty breathing, headache or visual disturbances    Complete by:  As directed      Call MD for:  extreme fatigue    Complete by:  As directed      Call MD for:  hives    Complete by:  As directed      Call MD for:  persistant dizziness or light-headedness    Complete by:  As directed      Call MD for:  persistant nausea and vomiting    Complete by:  As directed      Call MD for:  severe  uncontrolled pain    Complete by:  As directed      Call MD for:  temperature >100.4    Complete by:  As directed      Diet - low sodium heart healthy    Complete by:  As directed      Increase activity slowly    Complete by:  As directed             Medication List    STOP taking these medications        ibuprofen 200 MG tablet  Commonly known as:  ADVIL,MOTRIN     predniSONE 20 MG tablet  Commonly known as:  DELTASONE      TAKE these medications        albuterol (2.5 MG/3ML) 0.083% nebulizer solution  Commonly known as:  PROVENTIL  Take 3 mLs (2.5 mg total) by nebulization every 4 (four) hours as needed for wheezing or shortness of breath.     dextromethorphan-guaiFENesin 30-600 MG 12hr tablet  Commonly known as:  MUCINEX DM  Take 1 tablet by mouth 2 (two) times daily as needed for cough.     diphenhydrAMINE 50 MG/ML injection  Commonly known as:  BENADRYL  Inject 0.25 mLs (12.5 mg total) into the vein every 8 (eight) hours as needed for itching or allergies.     EPINEPHrine 0.3 mg/0.3  mL Soaj injection  Commonly known as:  EPI-PEN  Inject 0.3 mLs (0.3 mg total) into the muscle once.     famotidine 20 MG tablet  Commonly known as:  PEPCID  Take 1 tablet (20 mg total) by mouth 2 (two) times daily.     hydrOXYzine 25 MG tablet  Commonly known as:  ATARAX/VISTARIL  Take 1 tablet (25 mg total) by mouth every 6 (six) hours.     loratadine 10 MG tablet  Commonly known as:  CLARITIN  Take 1 tablet (10 mg total) by mouth daily.     methylPREDNISolone sodium succinate 125 mg/2 mL injection  Commonly known as:  SOLU-MEDROL  Inject 0.96 mLs (60 mg total) into the vein every 6 (six) hours.     naproxen 250 MG tablet  Commonly known as:  NAPROSYN  Take 1 tablet (250 mg total) by mouth 2 (two) times daily with a meal.     ondansetron 4 MG tablet  Commonly known as:  ZOFRAN  Take 1 tablet (4 mg total) by mouth every 6 (six) hours as needed for nausea.      oxyCODONE-acetaminophen 5-325 MG tablet  Commonly known as:  PERCOCET/ROXICET  Take 1-2 tablets by mouth every 4 (four) hours as needed for severe pain.          The results of significant diagnostics from this hospitalization (including imaging, microbiology, ancillary and laboratory) are listed below for reference.    Significant Diagnostic Studies: Dg Chest 2 View  04/18/2015  CLINICAL DATA:  Shortness of breath, feels like throat is closing, rash, no improvement following Epi-pen use, discharged from hospital 2 days ago for allergic reaction, personal history of asthma EXAM: CHEST  2 VIEW COMPARISON:  None FINDINGS: Minimal enlargement of cardiac silhouette. Mediastinal contours and pulmonary vascularity normal. Azygos fissure noted. Central peribronchial thickening which may be related to patient's history of asthma. Lungs clear. No pulmonary infiltrate, pleural effusion or pneumothorax. Prior laparoscopic gastric banding. No acute osseous findings. IMPRESSION: Minimal enlargement of cardiac silhouette. Peribronchial thickening question related to history of asthma. No acute infiltrate Electronically Signed   By: Lavonia Dana M.D.   On: 04/18/2015 17:21   Ct Soft Tissue Neck Wo Contrast  04/13/2015  CLINICAL DATA:  44 year old female with shortness of breath, difficulty breathing, sensation of throat closing for 5 days. Initial encounter. EXAM: CT NECK WITHOUT CONTRAST TECHNIQUE: Multidetector CT imaging of the neck was performed following the standard protocol without intravenous contrast. COMPARISON:  Cervical spine radiographs 11/14/2014. FINDINGS: Mild dependent atelectasis in the visualized upper lungs. Incidental azygos fissure in the right lung. Noncontrast visualized mediastinum appears within normal limits. Non contrast thyroid, larynx, pharynx, parapharyngeal spaces, retropharyngeal space, sublingual space, submandibular glands, and parotid glands are within normal limits. The  epiglottis is normal. No cervical lymphadenopathy. No inflammatory stranding identified in the neck. Visualized paranasal sinuses and mastoids are clear. No acute osseous abnormality identified. Negative visualized noncontrast brain parenchyma. Visualized orbits and scalp soft tissues are within normal limits. IMPRESSION: Negative noncontrast neck CT. Electronically Signed   By: Genevie Ann M.D.   On: 04/13/2015 15:35   Ct Angio Chest Pe W/cm &/or Wo Cm  04/18/2015  CLINICAL DATA:  Hypoxia. Cough. Recently discharged, similar symptoms. EXAM: CT ANGIOGRAPHY CHEST WITH CONTRAST TECHNIQUE: Multidetector CT imaging of the chest was performed using the standard protocol during bolus administration of intravenous contrast. Multiplanar CT image reconstructions and MIPs were obtained to evaluate the vascular anatomy. CONTRAST:  40m OMNIPAQUE  IOHEXOL 350 MG/ML SOLN COMPARISON:  Radiographs 04/18/2015 FINDINGS: Cardiovascular: There is good opacification of the pulmonary arteries. There is no pulmonary embolism. The thoracic aorta is normal in caliber and intact. Lungs: Linear atelectatic appearing opacities are present in the posterior lower lobes. The lungs are otherwise clear. Central airways: Patent Effusions: None Lymphadenopathy: None Esophagus: Unremarkable Upper abdomen: Incidentally noted lap band.  Prior cholecystectomy. Musculoskeletal: No significant abnormality Review of the MIP images confirms the above findings. IMPRESSION: Negative for acute pulmonary embolism. Mild atelectatic appearing opacities are present in the posterior lower lobe bilaterally. Electronically Signed   By: Andreas Newport M.D.   On: 04/18/2015 23:27    Microbiology: No results found for this or any previous visit (from the past 240 hour(s)).   Labs: Basic Metabolic Panel:  Recent Labs Lab 04/12/15 1640 04/13/15 0329 04/18/15 1920 04/19/15 0358  NA 142 135 139 138  K 3.5 3.7 3.9 4.3  CL 111 105 101 100*  CO2 21* '23 29 28   ' GLUCOSE 126* 129* 82 165*  BUN '12 11 17 15  ' CREATININE 0.69 0.66 0.83 0.97  CALCIUM 7.8* 8.1* 8.5* 8.4*   Liver Function Tests:  Recent Labs Lab 04/18/15 1920 04/19/15 0358  AST 19 412*  ALT 15 273*  ALKPHOS 53 127*  BILITOT 0.4 0.7  PROT 6.1* 4.7*  ALBUMIN 3.3* 2.8*   No results for input(s): LIPASE, AMYLASE in the last 168 hours. No results for input(s): AMMONIA in the last 168 hours. CBC:  Recent Labs Lab 04/12/15 1640 04/13/15 0329 04/18/15 1920 04/19/15 0358  WBC 11.0* 10.5 12.2* 12.5*  NEUTROABS 10.2*  --  8.1*  --   HGB 12.9 13.3 16.5* 14.6  HCT 39.5 40.1 49.6* 44.3  MCV 90.4 90.7 92.0 92.1  PLT 296 322 272 271   Cardiac Enzymes:  Recent Labs Lab 04/19/15 0358  CKTOTAL 13*   BNP: BNP (last 3 results) No results for input(s): BNP in the last 8760 hours.  ProBNP (last 3 results) No results for input(s): PROBNP in the last 8760 hours.  CBG:  Recent Labs Lab 04/19/15 0642  GLUCAP 119*       Signed:  Vernell Leep, MD, FACP, FHM. [facility] Pager [phone]  If 7PM-7AM, please contact night-coverage [email or web address] Password [email or web address] 04/19/2015, 3:29 PM

## 2015-04-20 LAB — ANTINUCLEAR ANTIBODIES, IFA: ANTINUCLEAR ANTIBODIES, IFA: NEGATIVE

## 2015-04-20 LAB — COMPLEMENT, TOTAL: Compl, Total (CH50): 50 U/mL (ref 42–60)

## 2015-04-20 LAB — ANTI-DNASE B ANTIBODY

## 2015-04-20 LAB — HEMOGLOBIN A1C
HEMOGLOBIN A1C: 5.5 % (ref 4.8–5.6)
MEAN PLASMA GLUCOSE: 111 mg/dL

## 2015-04-21 LAB — C1 ESTERASE INHIBITOR: C1INH SerPl-mCnc: 31 mg/dL (ref 21–39)

## 2015-04-22 LAB — C2 COMPLEMENT: C2 COMPLEMENT: 1.8 mg/dL (ref 1.6–3.5)

## 2015-04-25 LAB — COMPLEMENT COMPONENT C1Q: C1Q COMPLEMENT PROTEIN CC1Q: 17.5 mg/dL (ref 11.8–24.4)

## 2015-05-10 DIAGNOSIS — IMO0002 Reserved for concepts with insufficient information to code with codable children: Secondary | ICD-10-CM | POA: Insufficient documentation

## 2015-05-26 ENCOUNTER — Encounter: Payer: Self-pay | Admitting: *Deleted

## 2015-06-23 ENCOUNTER — Encounter: Payer: Self-pay | Admitting: Family Medicine

## 2015-06-23 ENCOUNTER — Ambulatory Visit (INDEPENDENT_AMBULATORY_CARE_PROVIDER_SITE_OTHER): Payer: Medicaid Other | Admitting: Family Medicine

## 2015-06-23 VITALS — BP 117/67 | HR 80 | Wt 242.6 lb

## 2015-06-23 DIAGNOSIS — R8789 Other abnormal findings in specimens from female genital organs: Secondary | ICD-10-CM

## 2015-06-23 DIAGNOSIS — A630 Anogenital (venereal) warts: Secondary | ICD-10-CM | POA: Diagnosis present

## 2015-06-23 DIAGNOSIS — B977 Papillomavirus as the cause of diseases classified elsewhere: Secondary | ICD-10-CM

## 2015-06-23 DIAGNOSIS — IMO0002 Reserved for concepts with insufficient information to code with codable children: Secondary | ICD-10-CM

## 2015-06-23 MED ORDER — IMIQUIMOD 5 % EX CREA
TOPICAL_CREAM | CUTANEOUS | Status: DC
Start: 2015-06-23 — End: 2015-06-26

## 2015-06-23 NOTE — Patient Instructions (Signed)
Imiquimod skin cream  What is this medicine?  IMIQUIMOD (i mi KWI mod) cream is used to treat external genital or anal warts. It is also used to treat other skin conditions such as actinic keratosis and certain types of skin cancer.  This medicine may be used for other purposes; ask your health care provider or pharmacist if you have questions.  What should I tell my health care provider before I take this medicine?  They need to know if you have any of these conditions:  -decreased immune function  -an unusual or allergic reaction to imiquimod, other medicines, foods, dyes, or preservatives  -pregnant or trying to get pregnant  -breast-feeding  How should I use this medicine?  This medicine is for external use only. Do not take by mouth. Follow the directions on the prescription label. Apply just before bedtime. Wash your hands before and after use. Apply a thin layer of cream and massage gently into the affected areas until no longer visible. Do not use in the mouth, eyes or the vagina. Use this medicine only on the affected area as directed by your health care provider. Do not use for longer than prescribed. It is important not to use more medicine than prescribed. To do so may increase the chance of side effects.  Talk to your pediatrician regarding the use of this medicine in children. While this drug may be prescribed for children as young as 12 years of age for selected conditions, precautions do apply.  Overdosage: If you think you have taken too much of this medicine contact a poison control center or emergency room at once.  NOTE: This medicine is only for you. Do not share this medicine with others.  What if I miss a dose?  If you miss a dose, use it as soon as you can. If it is almost time for your next dose, use only that dose. Do not use double or extra doses.  What may interact with this medicine?  Interactions are not expected. Do not use any other medicines on the treated area without asking your  doctor or health care professional.  This list may not describe all possible interactions. Give your health care provider a list of all the medicines, herbs, non-prescription drugs, or dietary supplements you use. Also tell them if you smoke, drink alcohol, or use illegal drugs. Some items may interact with your medicine.  What should I watch for while using this medicine?  Visit your health care professional for regular checks on your progress.  Do not use this medicine until the skin has healed from any other drug (example: podofilox or podophyllin resin) or surgical skin treatment.  Females should receive regular pelvic exams while being treated for genital warts. Most patients see improvement within 4 weeks. It may take up to 16 weeks to see a full clearing of the warts. This medicine is not a cure. New warts may develop during or after treatment. Avoid sexual (genital, anal, oral) contact while the cream is on the skin. If warts are visible in the genital area, sexual contact should be avoided until the warts are treated. The use of latex condoms during sexual contact may reduce, but not entirely prevent, infecting others. This medicine may weaken condoms, diaphragms, cervical caps or other barrier devices and make them less effective as birth control. Do not cover the treated area with an airtight bandage. Cotton gauze dressings can be used. Cotton underwear can be worn after using this medicine   on the genital or anal area.  Actinic keratoses that were not seen before may appear during treatment and may later go away. The treatment area and surrounding area may lighten or darken after treatment with this medicine. These skin color changes may be permanent in some patients.  If you experience a skin reaction at the treatment site that interferes or prevents you from doing any daily activity, contact your health care provider. You may need a rest period from treatment. Treatment may be restarted once the  reaction has gotten better as recommended by your doctor or health care professional.  This medicine can make you more sensitive to the sun. Keep out of the sun. If you cannot avoid being in the sun, wear protective clothing and use sunscreen. Do not use sun lamps or tanning beds/booths.  What side effects may I notice from receiving this medicine?  Side effects that you should report to your doctor or health care professional as soon as possible:  -open sores with or without drainage  -skin infection  -skin rash  -unusual or severe skin reaction  Side effects that usually do not require medical attention (report to your doctor or health care professional if they continue or are bothersome):  -burning or itching  -redness of the skin (very common but is usually not painful or harmful)  -scabbing, crusting, or peeling skin  -skin that becomes hard or thickened  -swelling of the skin  This list may not describe all possible side effects. Call your doctor for medical advice about side effects. You may report side effects to FDA at 1-800-FDA-1088.  Where should I keep my medicine?  Keep out of the reach of children.  Store between 4 and 25 degrees C (39 and 77 degrees F). Do not freeze. Throw away any unused medicine after the expiration date. Discard packet after applying to affected area. Partial packets should not be saved or reused.  NOTE: This sheet is a summary. It may not cover all possible information. If you have questions about this medicine, talk to your doctor, pharmacist, or health care provider.     © 2016, [employer]vier/Gold Standard. (2008-05-17 10:33:25)

## 2015-06-23 NOTE — Progress Notes (Signed)
   Subjective:    Patient ID: Melissa CoonsJennifer E Dodson, female    DOB: 05/05/1971, 45 y.o.   MRN: [id number]  HPI Patient referred to our clinic for assessment of genital warts, which started about 6 weeks ago.  In November, she had a PAP smear that revealed HR HPV, but no abnormal cells.  The warts have not changed in appearance - they have not increased in size since she has discovered them.  They have not spread.  They are fairly small in size.  Two weeks later, her warts appeared.  She has had a hysterectomy.  She is sexually active, but no new partners.    Review of Systems  Constitutional: Negative for fever, chills and fatigue.  Gastrointestinal: Negative for vomiting, abdominal pain and diarrhea.  Genitourinary: Negative for dysuria, vaginal bleeding, vaginal discharge, vaginal pain, pelvic pain and dyspareunia.  All other systems reviewed and are negative.  I have reviewed the patients past medical, family, and social history.  I have reviewed the patient's medication list and allergies.     Objective:   Physical Exam  Constitutional: She is oriented to person, place, and time. She appears well-developed and well-nourished.  HENT:  Head: Normocephalic and atraumatic.  Right Ear: External ear normal.  Left Ear: External ear normal.  Eyes: Pupils are equal, round, and reactive to light.  Cardiovascular: Normal rate, regular rhythm and normal heart sounds.   Pulmonary/Chest: Breath sounds normal. No respiratory distress. She has no wheezes. She has no rales. She exhibits no tenderness.  Genitourinary:     Neurological: She is alert and oriented to person, place, and time.  Skin: Skin is warm and dry.  Psychiatric: She has a normal mood and affect. Her behavior is normal. Judgment and thought content normal.       Assessment & Plan:  1. Genital warts Discussed treatment options - cryotherapy vs medical.  I discussed with her that ideally, treatment with imiquimod is preferred,  based on immune modulation.  Will treat three times weekly x 16 weeks.  F/u in 4 months.  Pt to call or RTC if having difficulty with medication.  2.  Abnormal HPV testing Repeat PAP in 1 year.

## 2015-06-26 ENCOUNTER — Other Ambulatory Visit: Payer: Self-pay | Admitting: General Practice

## 2015-06-26 DIAGNOSIS — A630 Anogenital (venereal) warts: Secondary | ICD-10-CM

## 2015-06-26 MED ORDER — IMIQUIMOD 5 % EX CREA: TOPICAL_CREAM | CUTANEOUS | Status: DC

## 2015-07-05 ENCOUNTER — Other Ambulatory Visit: Payer: Self-pay | Admitting: Physician Assistant

## 2015-07-05 DIAGNOSIS — Z1239 Encounter for other screening for malignant neoplasm of breast: Secondary | ICD-10-CM

## 2015-07-14 ENCOUNTER — Ambulatory Visit
Admission: RE | Admit: 2015-07-14 | Discharge: 2015-07-14 | Disposition: A | Discharge status: Home / Self Care | Payer: Medicaid Other | Source: Ambulatory Visit | Attending: Physician Assistant | Admitting: Physician Assistant

## 2015-07-14 DIAGNOSIS — Z1239 Encounter for other screening for malignant neoplasm of breast: Secondary | ICD-10-CM

## 2015-08-07 ENCOUNTER — Ambulatory Visit (HOSPITAL_COMMUNITY)
Admission: RE | Admit: 2015-08-07 | Discharge: 2015-08-07 | Disposition: A | Discharge status: Home / Self Care | Payer: Medicaid Other | Source: Ambulatory Visit | Attending: Orthopaedic Surgery | Admitting: Orthopaedic Surgery

## 2015-08-07 ENCOUNTER — Other Ambulatory Visit (HOSPITAL_COMMUNITY): Payer: Self-pay | Admitting: Orthopaedic Surgery

## 2015-08-07 DIAGNOSIS — M79604 Pain in right leg: Secondary | ICD-10-CM | POA: Diagnosis not present

## 2015-08-07 DIAGNOSIS — M79605 Pain in left leg: Secondary | ICD-10-CM | POA: Diagnosis not present

## 2015-08-07 DIAGNOSIS — R609 Edema, unspecified: Secondary | ICD-10-CM

## 2015-08-07 NOTE — Progress Notes (Signed)
Preliminary results by tech - Venous Duplex Lower Ext. Completed. Negative for deep and superficial vein thrombosis in both legs.  Fadil Macmaster, BS, RDMS, RVT  

## 2017-09-23 ENCOUNTER — Other Ambulatory Visit: Payer: Self-pay | Admitting: Physician Assistant

## 2017-09-23 DIAGNOSIS — Z139 Encounter for screening, unspecified: Secondary | ICD-10-CM

## 2017-10-16 ENCOUNTER — Ambulatory Visit: Payer: Self-pay

## 2017-11-03 ENCOUNTER — Ambulatory Visit
Admission: RE | Admit: 2017-11-03 | Discharge: 2017-11-03 | Disposition: A | Discharge status: Home / Self Care | Payer: BLUE CROSS/BLUE SHIELD | Source: Ambulatory Visit | Attending: Physician Assistant | Admitting: Physician Assistant

## 2017-11-03 DIAGNOSIS — Z139 Encounter for screening, unspecified: Secondary | ICD-10-CM

## 2018-04-06 DIAGNOSIS — E7800 Pure hypercholesterolemia, unspecified: Secondary | ICD-10-CM | POA: Insufficient documentation

## 2018-09-04 DIAGNOSIS — M222X2 Patellofemoral disorders, left knee: Secondary | ICD-10-CM | POA: Insufficient documentation

## 2018-12-11 DIAGNOSIS — M5416 Radiculopathy, lumbar region: Secondary | ICD-10-CM | POA: Insufficient documentation

## 2019-01-08 DIAGNOSIS — M7582 Other shoulder lesions, left shoulder: Secondary | ICD-10-CM | POA: Insufficient documentation

## 2019-01-08 DIAGNOSIS — G5622 Lesion of ulnar nerve, left upper limb: Secondary | ICD-10-CM | POA: Insufficient documentation

## 2019-08-02 DIAGNOSIS — M47812 Spondylosis without myelopathy or radiculopathy, cervical region: Secondary | ICD-10-CM | POA: Insufficient documentation

## 2019-10-05 ENCOUNTER — Other Ambulatory Visit: Payer: Self-pay | Admitting: Physician Assistant

## 2019-10-05 DIAGNOSIS — Z1231 Encounter for screening mammogram for malignant neoplasm of breast: Secondary | ICD-10-CM

## 2019-11-01 DIAGNOSIS — M533 Sacrococcygeal disorders, not elsewhere classified: Secondary | ICD-10-CM | POA: Insufficient documentation

## 2020-05-04 IMAGING — MG DIGITAL SCREENING BILATERAL MAMMOGRAM WITH TOMO AND CAD
8 series · 8 of 24 positions shown · non-contrast
Comparison: Previous exam(s).

CLINICAL DATA: Screening.

EXAM:
DIGITAL SCREENING BILATERAL MAMMOGRAM WITH TOMO AND CAD

[L MLO synth-2D]
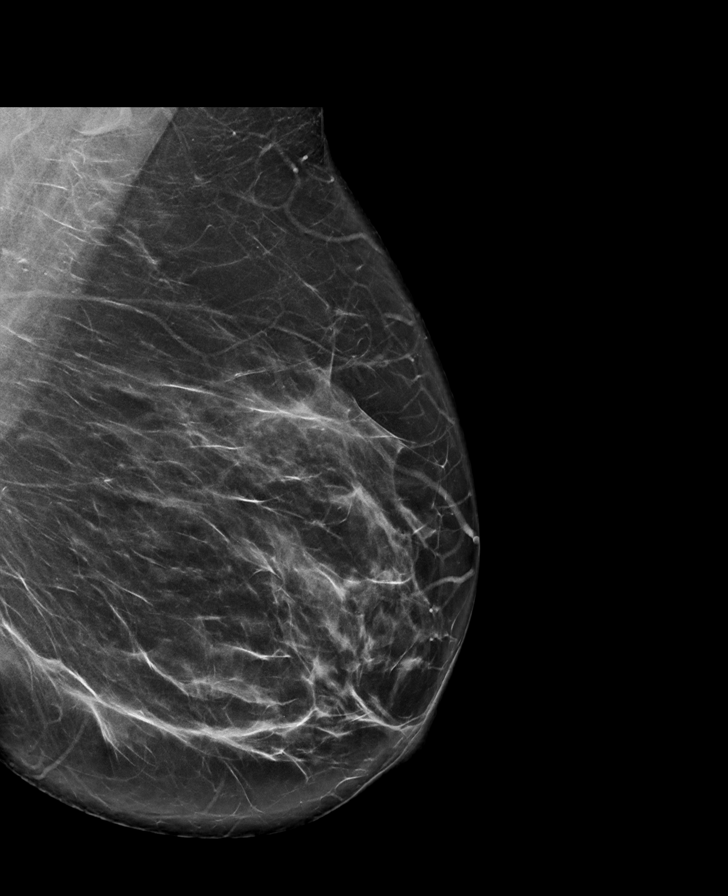

[R MLO synth-2D]
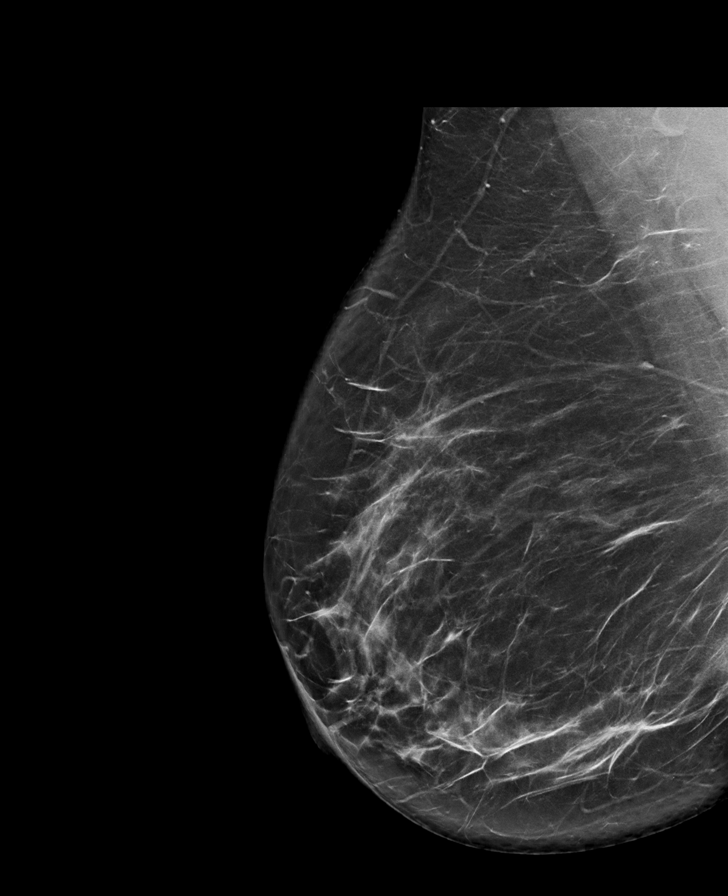

[R CC synth-2D]
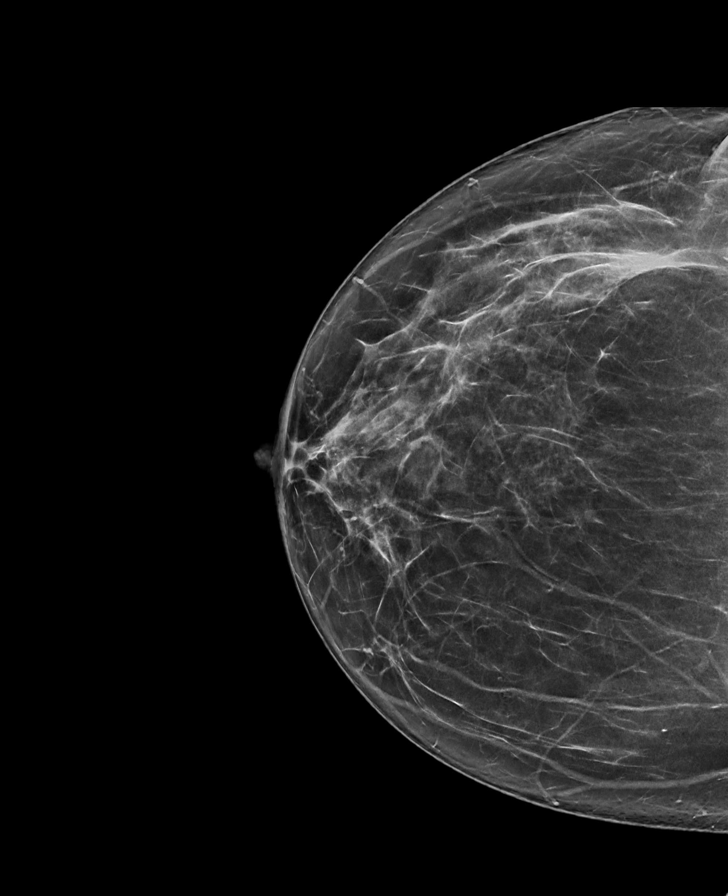

[L CC synth-2D]
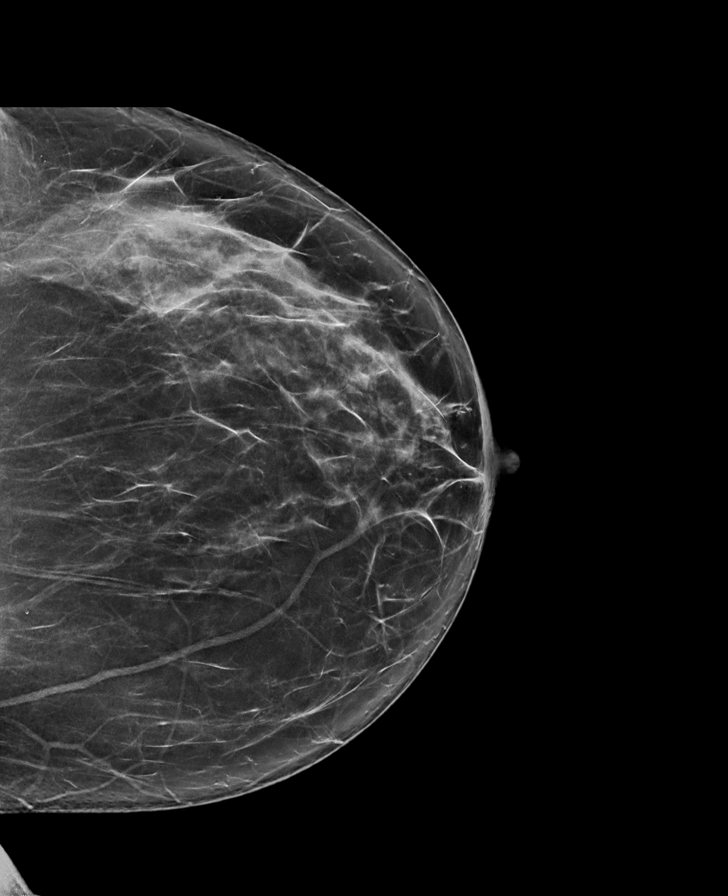

[L MLO tomo · tomo slice 46/91.0]
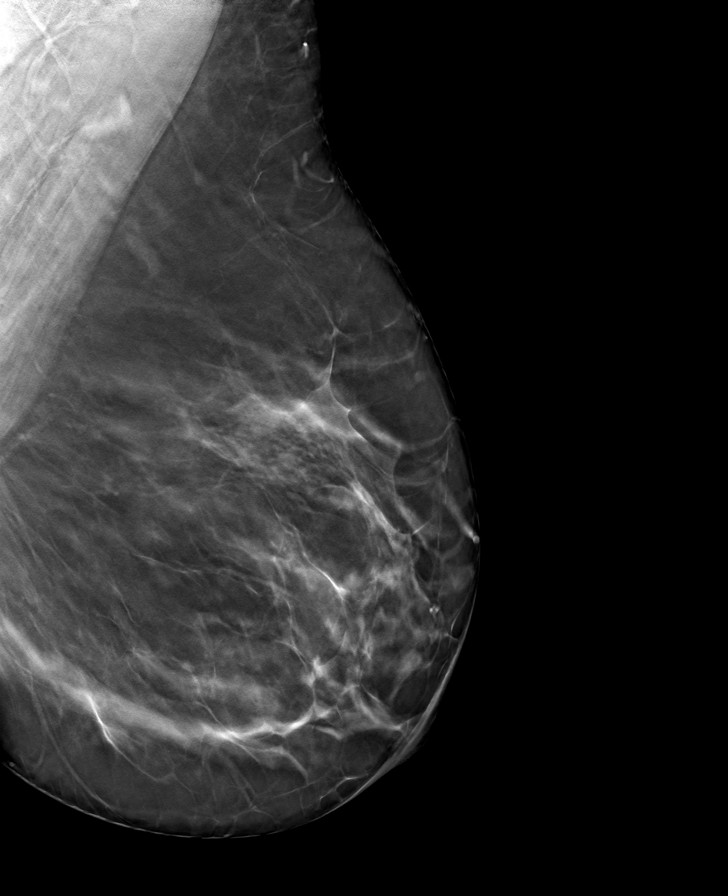

[L CC tomo · tomo slice 41/82.0]
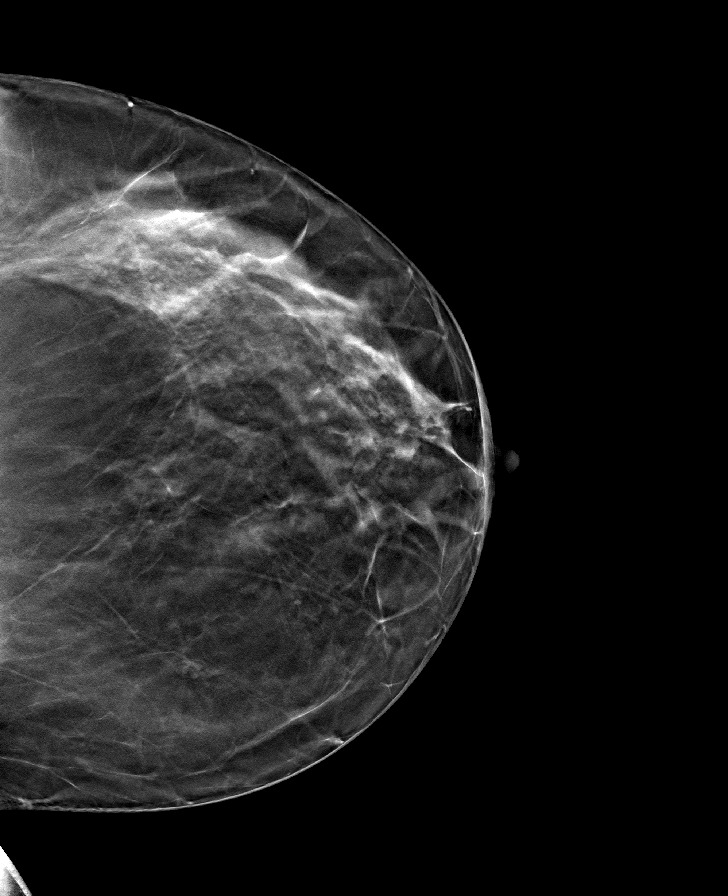

[R MLO tomo · tomo slice 47/92.0]
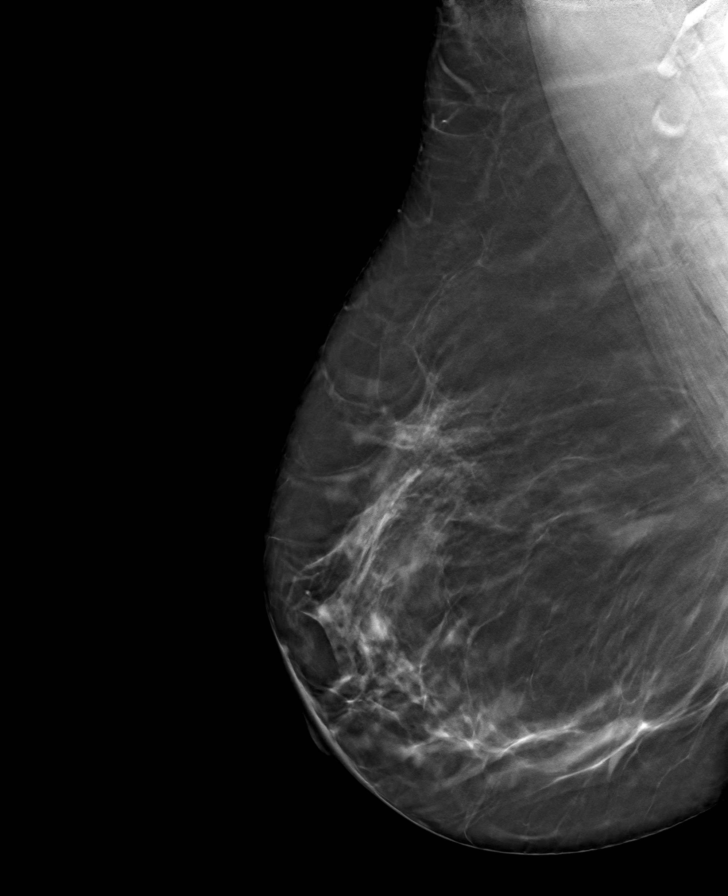

[R CC tomo · tomo slice 41/80.0]
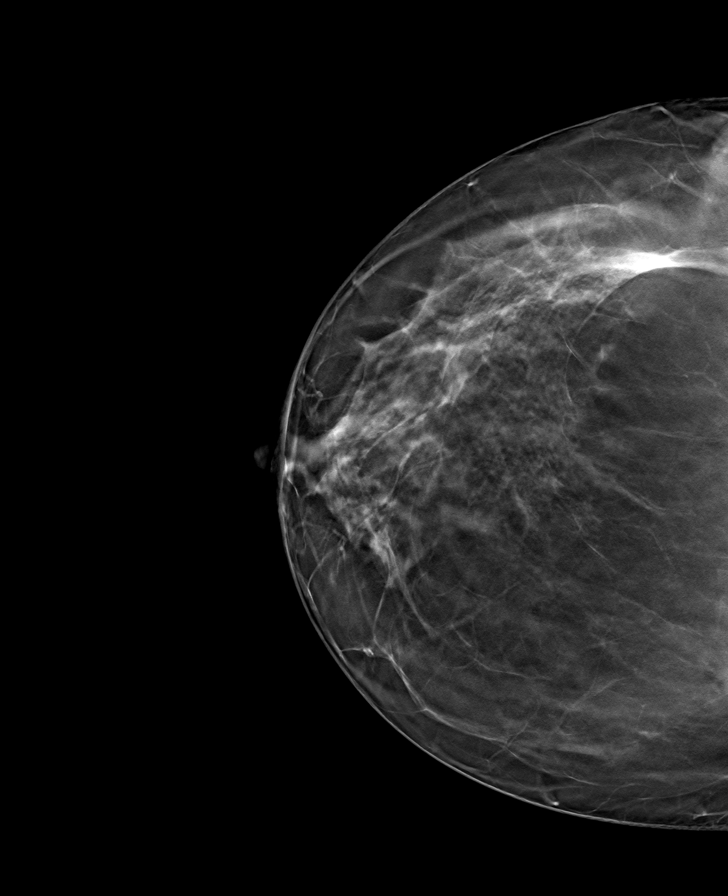

[8 of 24 positions shown; findings below may reference images not displayed]

ACR Breast Density Category b: There are scattered areas of
fibroglandular density.
FINDINGS: There are no findings suspicious for malignancy. Images were
processed with CAD.
IMPRESSION: No mammographic evidence of malignancy. A result letter of this
screening mammogram will be mailed directly to the patient.

RECOMMENDATION:
Screening mammogram in one year. (Code:[id number])

BI-RADS CATEGORY  1: Negative.

## 2021-09-05 DIAGNOSIS — M6702 Short Achilles tendon (acquired), left ankle: Secondary | ICD-10-CM | POA: Insufficient documentation

## 2022-11-25 ENCOUNTER — Ambulatory Visit: Payer: Self-pay | Admitting: Internal Medicine

## 2022-12-25 ENCOUNTER — Ambulatory Visit (INDEPENDENT_AMBULATORY_CARE_PROVIDER_SITE_OTHER): Payer: Commercial Managed Care - HMO | Admitting: Internal Medicine

## 2022-12-25 ENCOUNTER — Encounter: Payer: Self-pay | Admitting: Internal Medicine

## 2022-12-25 VITALS — BP 128/82 | HR 87 | Temp 98.3°F | Ht 70.0 in | Wt 265.4 lb

## 2022-12-25 DIAGNOSIS — G5621 Lesion of ulnar nerve, right upper limb: Secondary | ICD-10-CM | POA: Diagnosis not present

## 2022-12-25 DIAGNOSIS — F988 Other specified behavioral and emotional disorders with onset usually occurring in childhood and adolescence: Secondary | ICD-10-CM

## 2022-12-25 DIAGNOSIS — Z6838 Body mass index (BMI) 38.0-38.9, adult: Secondary | ICD-10-CM

## 2022-12-25 DIAGNOSIS — M67431 Ganglion, right wrist: Secondary | ICD-10-CM | POA: Insufficient documentation

## 2022-12-25 DIAGNOSIS — M25552 Pain in left hip: Secondary | ICD-10-CM | POA: Diagnosis not present

## 2022-12-25 DIAGNOSIS — M722 Plantar fascial fibromatosis: Secondary | ICD-10-CM | POA: Insufficient documentation

## 2022-12-25 DIAGNOSIS — Z111 Encounter for screening for respiratory tuberculosis: Secondary | ICD-10-CM

## 2022-12-25 DIAGNOSIS — N644 Mastodynia: Secondary | ICD-10-CM

## 2022-12-25 DIAGNOSIS — E6609 Other obesity due to excess calories: Secondary | ICD-10-CM

## 2022-12-25 DIAGNOSIS — M779 Enthesopathy, unspecified: Secondary | ICD-10-CM | POA: Diagnosis not present

## 2022-12-25 DIAGNOSIS — M170 Bilateral primary osteoarthritis of knee: Secondary | ICD-10-CM

## 2022-12-25 MED ORDER — AMPHETAMINE-DEXTROAMPHETAMINE 20 MG PO TABS: 20.0000 mg | ORAL_TABLET | Freq: Two times a day (BID) | ORAL | 0 refills | Status: DC

## 2022-12-25 NOTE — Progress Notes (Unsigned)
[facility] [address] [place] [address] Dept: [phone] Dept Fax: [phone]  New Patient Office Visit  Subjective:   Melissa Dodson 1971-02-12 12/25/2022  Chief Complaint  Patient presents with   Establish Care    HPI: [name] presents today to establish care at [facility] at [facility]. Patient has been seen by this provider at prior location [facility]. This is not provider's first encounter with patient.   Concerns: See below   Discussed the use of AI scribe software for clinical note transcription with the patient, who gave verbal consent to proceed.  History of Present Illness   The patient, with a history of ADD, bone spurs, plantar fasciitis, and arthritis in the knees, presents with multiple complaints. The chief complaint is persistent pain in the left hip, which has worsened over time and is now affecting the patient's walking. She reports bone spur in left hip. She has seen ortho for this in the past, and received steroid joint injections. The patient also reports a tingling sensation in the left leg and arm, which has been present for a few months and is gradually worsening. The patient describes the sensation as not being painful but constantly present and slightly uncomfortable.  She reports her plantar fasciitis is beginning to flare up, she is doing her exercises daily to help.   The patient also has a ganglion cyst on the right wrist, which has been present for some time but has recently become more painful. She would like this removed.   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 The patient had previously seen a specialist  (Dr. Doroteo Glassman with Ortho) for this issue. Did have EMG testing, along with [place] of right wrist on 12/28/21 which did not show any abnormalities. Patient was given exercises to do at home, which she stopped  doing after a while. It was also recommended for her to do a pillow support for arm, which she has not tried yet. If her symptoms persisted, it was advised she follow up in 6 weeks, but did not.    Patient complains of intermittent left breast pain onset 1 month ago. States when she bends down she sometimes gets sharp pain in breast.  Patient had a routine screening mammogram done on 12/10/2021 which showed calcifications in the left breast warranting further evaluation.  Patient underwent diagnostic mammogram of left breast on 12/12/2021 which showed calcifications in the lower left breast and a small mass in the upper outer aspect of the left breast.  It was recommended to repeat diagnostic left breast mammogram and ultrasound in 6 months.  Patient followed up on 06/12/2022 for 1-month follow-up diagnostic mammogram of left breast along with ultrasound, which showed a 0.46 x 0.41 x 0.26 cm oval slight hyperechoic lesion at of the left breast at 1 o'clock position 3 cm away from nipple.  Findings were slightly smaller compared to prior ultrasound.  Findings were considered probable benign, but after discussion with the patient she preferred to proceed with a needle biopsy of the left breast.  Biopsy was performed on 06/21/2022, and was negative for any atypia or malignancy.  She was recommended to continue with routine annual bilateral screening mammograms.     The patient also reports pain in the knees, which they attribute to due hx of osteoarthritis in knees and weight issues.  She follows a healthy diet, no routine exercise. She did have gastric  band surgery in 2013.  Would like to try [facility].     She needs refill of Adderall 20mg  BID, which is controlling her ADD very well.    The following portions of the patient's history were reviewed and updated as appropriate: past medical history, past surgical history, family history, social history, allergies, medications, and problem list.   Patient Active  Problem List   Diagnosis Date Noted   Plantar fasciitis 12/25/2022   Entrapment of right ulnar nerve 12/25/2022   Ganglion cyst of dorsum of right wrist 12/25/2022   Pain of left hip 12/25/2022   Bone spur of other site 12/25/2022   Primary osteoarthritis of both knees 12/25/2022   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   Breast pain, left 12/25/2022   Tightness of left heel cord 09/05/2021   SI (sacroiliac) pain 11/01/2019   Spondylosis without myelopathy or radiculopathy, cervical region 08/02/2019   Ulnar neuropathy of left upper extremity 01/08/2019   Tendinitis of left rotator cuff 01/08/2019   Lumbar radiculopathy 12/11/2018   Patellofemoral stress syndrome of left knee 09/04/2018   Pure hypercholesterolemia 04/06/2018   Class 3 severe obesity due to excess calories with serious comorbidity and body mass index (BMI) of 40.0 to 44.9 in adult (HCC) 02/19/2018   Abnormal cervical Pap smear with positive HPV DNA test 05/10/2015   Reactive depression 04/19/2015   Chronic asthma without complication 04/18/2015   Anaphylactic reaction 04/12/2015   Past Medical History:  Diagnosis Date   Asthma with exacerbation 09/01/2014   Depression    Headache    History of laparoscopic adjustable gastric banding-done in [place] 01/26/2014   No info on type of band   Hypoxia    S/P gastric surgery 09/01/2014   Status post laparoscopic cholecystectomy August 2015 01/28/2014   Past Surgical History:  Procedure Laterality Date   ABDOMINAL HYSTERECTOMY     CHOLECYSTECTOMY N/A 01/28/2014   Procedure: LAPAROSCOPIC CHOLECYSTECTOMY WITH INTRAOPERATIVE CHOLANGIOGRAM;  Surgeon: Valarie Merino, MD;  Location: [facility];  Service: General;  Laterality: N/A;   KNEE SURGERY     LAPAROSCOPIC GASTRIC BANDING N/A 2013   performed in [place]   TONSILLECTOMY     Family History  Adopted: Yes  Family history unknown: Yes   Outpatient Medications Prior to Visit  Medication Sig  Dispense Refill   BIOTIN PO Take by mouth daily.     amphetamine-dextroamphetamine (ADDERALL) 20 MG tablet Take 20 mg by mouth 2 (two) times daily.     EPINEPHrine 0.3 mg/0.3 mL IJ SOAJ injection Inject 0.3 mLs (0.3 mg total) into the muscle once. 1 Device 1   escitalopram (LEXAPRO) 20 MG tablet Take 20 mg by mouth.     imiquimod (ALDARA) 5 % cream Apply topically 3 (three) times a week. 12 each 3   traZODone (DESYREL) 50 MG tablet TK 1 T PO QHS  0   No facility-administered medications prior to visit.   Allergies  Allergen Reactions   Nystatin Anaphylaxis and Swelling    Patient broke out in rash on extremities, had swelling of the nasal passage, throat and lips. Reaction was increasing over the course of 1-3 days.   Mango Flavor Swelling   Mangifera Indica Hives   Sulfa Antibiotics Hives    ROS: A complete ROS was performed with pertinent positives/negatives noted in the HPI. The remainder of the ROS are negative.   Objective:   Today's Vitals   12/25/22 0858  BP: 128/82  Pulse: 87  Temp: 98.3  F (36.8 C)  TempSrc: Temporal  SpO2: 97%  Weight: 265 lb 6.4 oz (120.4 kg)  Height: 5\' 10"  (1.778 m)   GENERAL: Well-appearing, in NAD. Well nourished.  SKIN: Pink, warm and dry.  RESPIRATORY: Chest wall symmetrical. Respirations even and non-labored.  CARDIAC:. Peripheral pulses 2+ bilaterally.  MSK: Muscle tone and strength appropriate for age.  EXTREMITIES: Without clubbing, cyanosis, or edema.  NEUROLOGIC:  Steady, even gait.  PSYCH/MENTAL STATUS: Alert, oriented x 3. Cooperative, appropriate mood and affect.   Health Maintenance Due  Topic Date Due   Colonoscopy  Never done   MAMMOGRAM  12/03/2020    No results found for any visits on 12/25/22.     Assessment & Plan:  1. Pain of left hip - DG HIP UNILAT W OR W/O PELVIS 2-3 VIEWS LEFT; Future Order hip X-ray. -Consider referral to orthopedic specialist for further evaluation and management.  2. Bone spur of other  site - Order XR hip   3. Plantar fasciitis - continue exercises  4. Ganglion cyst of dorsum of right wrist - Ambulatory referral to Orthopedic Surgery  5. Entrapment of right ulnar nerve -Resume exercises and use of pillow splint at night. -Consider follow-up with Dr. Doroteo Glassman if symptoms persist.  6. Breast pain, left - warm compresses  - ibuprofen/tylenol as needed  - may need to consider further imaging if pain persists  7. Primary osteoarthritis of both knees -Continue current management. -Consider follow up with patient's orthopedic specialist if pain continues to worsen.  8. Class 2 obesity due to excess calories without serious comorbidity with body mass index (BMI) of 38.0 to 38.9 in adult   9. Attention deficit disorder (ADD) without hyperactivity - amphetamine-dextroamphetamine (ADDERALL) 20 MG tablet; Take 1 tablet (20 mg total) by mouth 2 (two) times daily.  Dispense: 60 tablet; Refill: 0     Breast Pain: Persistent pain following biopsy. - ibuprofen   General Health Maintenance: -Annual physical in three months. -Fasting labs to be ordered at that time. -Handicap form for parking due to chronic knee and hip pain.        Return in about 3 months (around 03/27/2023) for Fasting Annual Physical Exam.   Of note, portions of this note may have been created with voice recognition software Physicist, medical). While this note has been edited for accuracy, occasional wrong-word or 'sound-a-like' substitutions may have occurred due to the inherent limitations of voice recognition software.  Salvatore Decent, FNP

## 2022-12-26 ENCOUNTER — Telehealth: Payer: Self-pay | Admitting: Internal Medicine

## 2022-12-26 MED ORDER — SEMAGLUTIDE-WEIGHT MANAGEMENT 0.25 MG/0.5ML ~~LOC~~ SOAJ
SUBCUTANEOUS | 2 refills | Status: AC
Start: 2022-12-26 — End: ?

## 2022-12-26 NOTE — Telephone Encounter (Signed)
Pt called stating that she would be out of town from Wednesday, and is asking if she can get her prescription sent to [facility] earlier than wed.  Medication is amphetamine-dextroamphetamine (ADDERALL) 20 MG tablet [[id number]]

## 2022-12-26 NOTE — Telephone Encounter (Signed)
Spoke with patient and she said she can not pick up Rx from the pharmacy until 7/17 and is asking for an early release to pick up on 7/16 because she is going out of town   amphetamine-dextroamphetamine (ADDERALL) 20 MG tablet

## 2022-12-26 NOTE — Telephone Encounter (Signed)
Needs handicap form and TB screen

## 2022-12-27 ENCOUNTER — Ambulatory Visit (HOSPITAL_BASED_OUTPATIENT_CLINIC_OR_DEPARTMENT_OTHER)
Admission: RE | Admit: 2022-12-27 | Discharge: 2022-12-27 | Disposition: A | Discharge status: Home / Self Care | Payer: Commercial Managed Care - HMO | Source: Ambulatory Visit | Attending: Internal Medicine | Admitting: Internal Medicine

## 2022-12-27 ENCOUNTER — Telehealth: Payer: Self-pay | Admitting: Internal Medicine

## 2022-12-27 ENCOUNTER — Other Ambulatory Visit (INDEPENDENT_AMBULATORY_CARE_PROVIDER_SITE_OTHER): Payer: Commercial Managed Care - HMO

## 2022-12-27 ENCOUNTER — Other Ambulatory Visit: Payer: Self-pay

## 2022-12-27 ENCOUNTER — Ambulatory Visit: Payer: Commercial Managed Care - HMO | Admitting: Surgical

## 2022-12-27 DIAGNOSIS — M722 Plantar fascial fibromatosis: Secondary | ICD-10-CM | POA: Diagnosis not present

## 2022-12-27 DIAGNOSIS — M25531 Pain in right wrist: Secondary | ICD-10-CM

## 2022-12-27 DIAGNOSIS — M67431 Ganglion, right wrist: Secondary | ICD-10-CM

## 2022-12-27 DIAGNOSIS — M25552 Pain in left hip: Secondary | ICD-10-CM | POA: Diagnosis not present

## 2022-12-27 NOTE — Telephone Encounter (Signed)
Pt called asking to know if her handicap tag is ready for pick up. Pt would like you to pls give her a call back.

## 2022-12-27 NOTE — Telephone Encounter (Signed)
Form is ready for pick up,  patient was notified yesterday via MyChart message

## 2022-12-27 NOTE — Telephone Encounter (Signed)
Spoke with pharmacy and requested an early release of Rx on 7/16.  Pharmacy confirmed it is ok and will make a note

## 2022-12-29 ENCOUNTER — Encounter: Payer: Self-pay | Admitting: Surgical

## 2022-12-29 NOTE — Progress Notes (Signed)
Office Visit Note   Patient: Melissa Dodson           Date of Birth: 06-01-1971           MRN: [id number] Visit Date: 12/27/2022 Requested by: Salvatore Decent, FNP [address] [place],  [place] [address] PCP: Salvatore Decent, FNP  Subjective: Chief Complaint  Patient presents with   Right Wrist - Pain    HPI: Melissa Dodson is a 52 y.o. female who presents to the office reporting right wrist pain.  Patient states that she has history of ganglion cyst on the volar aspect of the right wrist that has been present for several years.  More recently and has been causing more and more pain in the last 3 months.  This started while she was working on her bachelor's degree.  Recently transferred to [facility] to work on a degree for social work.  Causes pain when she moves her wrist primarily in an extended position or with radial/ulnar deviation.  She is right-hand dominant.  She states she has a history of ulnar nerve neuropathy in this arm as well.  She will has radiation of pain from her wrist into the index, middle, ring fingers.  She will also have occasional radiation of pain down from the medial elbow that radiates down the ulnar aspect of the forearm into the fourth and fifth digits of the right hand.  All of this pain is on the palmar aspect of the hand.  She also complains of plantars fasciitis pain that she localizes to the underside of the left foot on the medial aspect.  She has history of 2 prior plantar fascia injections that were over 1 year ago and gave her great relief for about 6 to 8 months.  She also keeps up with doing heel cord stretches but does not do a lot of physical manipulation with a water bottle or tennis ball.  Denies any history of surgery to the foot.  Pain bothers her primarily when she gets out of bed in the morning but gets better as she walks more.  Also has some hip pain but this is being evaluated by her PCP.              ROS: All systems reviewed are negative as  they relate to the chief complaint within the history of present illness.  Patient denies fevers or chills.  Assessment & Plan: Visit Diagnoses:  1. Ganglion cyst of volar aspect of right wrist   2. Pain in right wrist   3. Plantar fasciitis of left foot     Plan: Patient is a 52 year old female who presents for evaluation of right wrist pain.  She has a small ganglion cyst that is noted on the volar aspect of the right wrist.  Ultrasound examination today demonstrates that it is a small cyst with a stalk that seems to originate from the STT joint.  The cyst itself does not seem very close to the radial artery or the median nerve based on this evaluation today.  It is very superficial.  We discussed options available patient.  She states that this focal pain is really the main concern she has and she wants this cyst removed.  She understands that this likely will not help with the shooting pain she has into the palmar aspect of her fingers which likely has some contribution from carpal tunnel syndrome and ulnar neuropathy.  She would still like to proceed with surgical management.  Plan  for referral to see Dr. Fara Boros so that she will be set up for him in case she ever eventually needs surgery for carpal tunnel syndrome/ulnar neuropathy.  Regarding her left foot pain, she would like to try a plantar fascial injection which has given her good relief in the past.  This was administered under ultrasound guidance.  There is excellent flow of the injection without any significant resistance.  Patient reported significant symptomatic improvement after administration of the injection.   Follow-Up Instructions: No follow-ups on file.   Orders:  Orders Placed This Encounter  Procedures   XR Wrist Complete Right   US Guided Needle Placement - No Linked Charges   No orders of the defined types were placed in this encounter.     Procedures: Foot Inj: left plantar fascia  Date/Time: 12/27/2022 8:39  PM  Performed by: Julieanne Cotton, PA-C Authorized by: Julieanne Cotton, PA-C   Consent Given by:  Patient Site marked: the procedure site was marked   Timeout: prior to procedure the correct patient, procedure, and site was verified   Indications:  Fasciitis and pain Condition: Plantar Fasciitis   Location: left plantar fascia muscle   Prep: patient was prepped and draped in usual sterile fashion   Needle Size:  22 G Approach:  Plantar Medications:  3 mL lidocaine 1 %; 1 mL bupivacaine 0.5 %; 13.33 mg methylPREDNISolone acetate 40 MG/ML Patient Tolerance:  Patient tolerated the procedure well with no immediate complications    Clinical Data: No additional findings.  Objective: Vital Signs: There were no vitals taken for this visit.  Physical Exam:  Constitutional: Patient appears well-developed HEENT:  Head: Normocephalic Eyes:EOM are normal Neck: Normal range of motion Cardiovascular: Normal rate Pulmonary/chest: Effort normal Neurologic: Patient is alert Skin: Skin is warm Psychiatric: Patient has normal mood and affect  Ortho Exam: Ortho exam demonstrates right wrist with small ganglion cyst noted on the volar side of the right wrist.  She has tenderness over this area.  Cyst is localized near the scaphoid tubercle.  Notes actual tenderness over the scaphoid tubercle which she does have some tenderness in the anatomic snuffbox.  No tenderness in the 3-4 portal, 4-5 portal.  No tenderness over the radial styloid.  She has intact EPL, FPL, finger abduction.  Increased pain with wrist extension and ulnar/radial deviation.  No much pain with wrist flexion.  No cellulitis or skin changes noted.  She does have positive Durkan sign but negative Tinel and negative Phalen sign.  She has no subluxing ulnar nerve in the right elbow but does have positive Tinel sign over the ulnar nerve at the elbow demonstrating shooting pain down the ulnar aspect of the forearm into the right hand.   She has no significant atrophy noted in the hand.  She also has foot evaluation today demonstrating palpable DP pulse.  Intact ankle dorsiflexion plantarflexion and inversion/eversion.  She has tenderness over the plantar aspect of the foot near the medial plantar fascial cord close to its attachment on the calcaneus.  Specialty Comments:  No specialty comments available.  Imaging: No results found.   PMFS History: Patient Active Problem List   Diagnosis Date Noted   Plantar fasciitis 12/25/2022   Entrapment of right ulnar nerve 12/25/2022   Ganglion cyst of dorsum of right wrist 12/25/2022   Pain of left hip 12/25/2022   Bone spur of other site 12/25/2022   Primary osteoarthritis of both knees 12/25/2022   Attention deficit disorder (ADD) without  hyperactivity 12/25/2022   Breast pain, left 12/25/2022   Tightness of left heel cord 09/05/2021   SI (sacroiliac) pain 11/01/2019   Spondylosis without myelopathy or radiculopathy, cervical region 08/02/2019   Ulnar neuropathy of left upper extremity 01/08/2019   Tendinitis of left rotator cuff 01/08/2019   Lumbar radiculopathy 12/11/2018   Patellofemoral stress syndrome of left knee 09/04/2018   Pure hypercholesterolemia 04/06/2018   Class 3 severe obesity due to excess calories with serious comorbidity and body mass index (BMI) of 40.0 to 44.9 in adult (HCC) 02/19/2018   Abnormal cervical Pap smear with positive HPV DNA test 05/10/2015   Reactive depression 04/19/2015   Chronic asthma without complication 04/18/2015   Anaphylactic reaction 04/12/2015   Past Medical History:  Diagnosis Date   Asthma with exacerbation 09/01/2014   Depression    Headache    History of laparoscopic adjustable gastric banding-done in [place] 01/26/2014   No info on type of band   Hypoxia    S/P gastric surgery 09/01/2014   Status post laparoscopic cholecystectomy August 2015 01/28/2014    Family History  Adopted: Yes  Family history unknown:  Yes    Past Surgical History:  Procedure Laterality Date   ABDOMINAL HYSTERECTOMY     CHOLECYSTECTOMY N/A 01/28/2014   Procedure: LAPAROSCOPIC CHOLECYSTECTOMY WITH INTRAOPERATIVE CHOLANGIOGRAM;  Surgeon: Valarie Merino, MD;  Location: [facility];  Service: General;  Laterality: N/A;   KNEE SURGERY     LAPAROSCOPIC GASTRIC BANDING N/A 2013   performed in [place]   TONSILLECTOMY     Social History   Occupational History   Not on file  Tobacco Use   Smoking status: Never   Smokeless tobacco: Never  Substance and Sexual Activity   Alcohol use: Yes    Comment: social on occ   Drug use: No   Sexual activity: Yes    Birth control/protection: Surgical

## 2022-12-31 ENCOUNTER — Ambulatory Visit: Payer: Commercial Managed Care - HMO

## 2022-12-31 ENCOUNTER — Other Ambulatory Visit: Payer: Commercial Managed Care - HMO

## 2022-12-31 DIAGNOSIS — Z111 Encounter for screening for respiratory tuberculosis: Secondary | ICD-10-CM

## 2023-01-01 MED ORDER — BUPIVACAINE HCL 0.5 % IJ SOLN
1.0000 mL | INTRAMUSCULAR | Status: AC | PRN
Start: 2022-12-27 — End: 2022-12-27
  Administered 2022-12-27: 1 mL

## 2023-01-01 MED ORDER — LIDOCAINE HCL 1 % IJ SOLN
3.0000 mL | INTRAMUSCULAR | Status: AC | PRN
Start: 2022-12-27 — End: 2022-12-27
  Administered 2022-12-27: 3 mL

## 2023-01-01 MED ORDER — METHYLPREDNISOLONE ACETATE 40 MG/ML IJ SUSP
13.3300 mg | INTRAMUSCULAR | Status: AC | PRN
Start: 2022-12-27 — End: 2022-12-27
  Administered 2022-12-27: 13.33 mg

## 2023-01-02 LAB — QUANTIFERON-TB GOLD PLUS
Mitogen-NIL: 8.19 IU/mL
NIL: 0.03 IU/mL
QuantiFERON-TB Gold Plus: NEGATIVE
TB1-NIL: 0.01 IU/mL
TB2-NIL: 0.02 IU/mL

## 2023-01-08 NOTE — Telephone Encounter (Signed)
Hey taylor can you make appointment for Melissa Dodson to see me on 01/24/23?

## 2023-01-20 ENCOUNTER — Telehealth: Payer: Self-pay | Admitting: Internal Medicine

## 2023-01-20 ENCOUNTER — Encounter: Payer: Self-pay | Admitting: Internal Medicine

## 2023-01-20 ENCOUNTER — Other Ambulatory Visit: Payer: Self-pay | Admitting: Internal Medicine

## 2023-01-20 DIAGNOSIS — F988 Other specified behavioral and emotional disorders with onset usually occurring in childhood and adolescence: Secondary | ICD-10-CM

## 2023-01-20 NOTE — Telephone Encounter (Signed)
Pt will upload the records you requested to complete her form. She would like to pick it up Friday if possible

## 2023-01-20 NOTE — Telephone Encounter (Signed)
Forms will be ready for pick up when immunizations are received

## 2023-01-22 ENCOUNTER — Telehealth: Payer: Self-pay

## 2023-01-22 ENCOUNTER — Other Ambulatory Visit: Payer: Self-pay | Admitting: Internal Medicine

## 2023-01-22 DIAGNOSIS — Z789 Other specified health status: Secondary | ICD-10-CM

## 2023-01-22 DIAGNOSIS — Z9229 Personal history of other drug therapy: Secondary | ICD-10-CM

## 2023-01-22 NOTE — Telephone Encounter (Signed)
Error

## 2023-01-22 NOTE — Telephone Encounter (Signed)
Please notify patient that her vaccine record she provided does not show Hepatitis B vaccination or completion of MMR vaccination series.   She will need to come to the clinic for Hep B vaccination (2 doses, 1 month apart) as [employer] paperwork states the titer is not accepted, must have vaccination.  She also needs to come in for MMR titer to be drawn.   Please schedule lab/nurse visit. She needs to be scheduled for Hep B vaccine administration. She will need 2 doses total, each dose given 1 month apart.

## 2023-01-23 ENCOUNTER — Other Ambulatory Visit: Payer: Self-pay | Admitting: Internal Medicine

## 2023-01-23 ENCOUNTER — Ambulatory Visit: Payer: Self-pay | Admitting: Orthopaedic Surgery

## 2023-01-23 DIAGNOSIS — F988 Other specified behavioral and emotional disorders with onset usually occurring in childhood and adolescence: Secondary | ICD-10-CM

## 2023-01-23 MED ORDER — AMPHETAMINE-DEXTROAMPHETAMINE 20 MG PO TABS: 20.0000 mg | ORAL_TABLET | Freq: Two times a day (BID) | ORAL | 0 refills | Status: DC

## 2023-01-23 MED ORDER — AMPHETAMINE-DEXTROAMPHETAMINE 20 MG PO TABS
20.0000 mg | ORAL_TABLET | Freq: Two times a day (BID) | ORAL | 0 refills | Status: DC
Start: 2023-01-23 — End: 2023-03-21

## 2023-01-23 NOTE — Telephone Encounter (Signed)
Patient informed and scheduled.

## 2023-01-24 ENCOUNTER — Other Ambulatory Visit: Payer: Commercial Managed Care - HMO

## 2023-01-24 ENCOUNTER — Other Ambulatory Visit (INDEPENDENT_AMBULATORY_CARE_PROVIDER_SITE_OTHER): Payer: Commercial Managed Care - HMO

## 2023-01-24 ENCOUNTER — Ambulatory Visit: Payer: Commercial Managed Care - HMO | Admitting: Surgical

## 2023-01-24 ENCOUNTER — Other Ambulatory Visit: Payer: Self-pay

## 2023-01-24 DIAGNOSIS — M25552 Pain in left hip: Secondary | ICD-10-CM | POA: Diagnosis not present

## 2023-01-24 DIAGNOSIS — Z789 Other specified health status: Secondary | ICD-10-CM | POA: Diagnosis not present

## 2023-01-24 DIAGNOSIS — Z9229 Personal history of other drug therapy: Secondary | ICD-10-CM

## 2023-01-25 ENCOUNTER — Encounter: Payer: Self-pay | Admitting: Surgical

## 2023-01-25 MED ORDER — METHYLPREDNISOLONE ACETATE 40 MG/ML IJ SUSP
40.0000 mg | INTRAMUSCULAR | Status: AC | PRN
Start: 2023-01-24 — End: 2023-01-24
  Administered 2023-01-24: 40 mg via INTRA_ARTICULAR

## 2023-01-25 MED ORDER — LIDOCAINE HCL 1 % IJ SOLN
5.0000 mL | INTRAMUSCULAR | Status: AC | PRN
Start: 2023-01-24 — End: 2023-01-24
  Administered 2023-01-24: 5 mL

## 2023-01-25 MED ORDER — BUPIVACAINE HCL 0.25 % IJ SOLN
4.0000 mL | INTRAMUSCULAR | Status: AC | PRN
Start: 2023-01-24 — End: 2023-01-24
  Administered 2023-01-24: 4 mL via INTRA_ARTICULAR

## 2023-01-25 NOTE — Progress Notes (Signed)
Office Visit Note   Patient: Melissa Dodson           Date of Birth: 1971/03/24           MRN: [id number] Visit Date: 01/24/2023 Requested by: Salvatore Decent, FNP [address] [place],  [place] [address] PCP: Salvatore Decent, FNP  Subjective: Chief Complaint  Patient presents with   Left Hip - Pain    HPI: Melissa Dodson is a 52 y.o. female who presents to the office reporting left hip pain.  Localizes pain to the lateral aspect of the hip.  She states that pain has been ongoing for months.  She has had previous injections at another orthopedic clinic for this problem with left trochanteric injection on 03/15/2022 that gave her great relief followed by another trochanteric injection on 06/27/2022 that gave her minimal relief.  She states that pain is localized to the lateral aspect of the hip with no significant groin pain.  She has difficulty laying on her left side but also on her right side that laying on her left feels more painful.  Pain will wake her up from sleep at night if she rolls onto either side.  Taking ibuprofen with not much relief.  She states that pain is also worse with sitting or walking.  No low back pain.  No neck pain.  No prior hip or back surgery.  She does have some numbness that radiates down from the buttock and lateral thigh down to the dorsum of her foot.  She has no tingling or weakness that she has noted..                ROS: All systems reviewed are negative as they relate to the chief complaint within the history of present illness.  Patient denies fevers or chills.  Assessment & Plan: Visit Diagnoses:  1. Greater trochanteric pain syndrome of left lower extremity   2. Pain in left hip     Plan: Patient is a 52 year old female who presents for evaluation of left hip pain.  Has hip pain localizing to the lateral aspect of the hip consistent with trochanteric bursitis which has responded great to an injection in September 2023 by another provider.   Subsequent injection in January 2024 did not really give much relief.  We discussed options available to patient such as living with her symptoms versus physical therapy versus trying another injection versus MRI evaluation of the pelvis.  She would like to proceed with trying another injection and if no relief from this injection, next step would be MRI.  She is not ambulating with a Trendelenburg gait.  We discussed the risks of cortisone injection around the trochanter such as gluteal tendon rupture and she would like to proceed.  Under ultrasound guidance, cortisone injection successfully administered and patient tolerated procedure well.  There was excellent flow of the injection during administration with no resistance.  She will send a message in about a week letting me know how her relief is and if no better, proceed with MRI.  Follow-Up Instructions: No follow-ups on file.   Orders:  Orders Placed This Encounter  Procedures   US Guided Needle Placement - No Linked Charges   No orders of the defined types were placed in this encounter.     Procedures: Large Joint Inj: L greater trochanter on 01/24/2023 12:52 PM Indications: pain and diagnostic evaluation Details: 22 G 3.5 in needle, ultrasound-guided lateral approach  Arthrogram: No  Medications: 5 mL lidocaine  1 %; 4 mL bupivacaine 0.25 %; 40 mg methylPREDNISolone acetate 40 MG/ML Outcome: tolerated well, no immediate complications Procedure, treatment alternatives, risks and benefits explained, specific risks discussed. Consent was given by the patient. Immediately prior to procedure a time out was called to verify the correct patient, procedure, equipment, support staff and site/side marked as required. Patient was prepped and draped in the usual sterile fashion.       Clinical Data: No additional findings.  Objective: Vital Signs: There were no vitals taken for this visit.  Physical Exam:  Constitutional: Patient appears  well-developed HEENT:  Head: Normocephalic Eyes:EOM are normal Neck: Normal range of motion Cardiovascular: Normal rate Pulmonary/chest: Effort normal Neurologic: Patient is alert Skin: Skin is warm Psychiatric: Patient has normal mood and affect  Ortho Exam: Ortho exam demonstrates left hip with no pain with hip range of motion.  Negative FADIR sign.  Negative Stinchfield sign.  She has negative straight leg raise bilaterally.  Intact hip flexion, quadricep, hamstring, dorsiflexion, plantarflexion, EHL rated 5/5.  No tenderness through the lumbar spine.  Does have moderate to severe tenderness over the left greater trochanter that is only present to a mild degree in the right greater trochanter.  Specialty Comments:  No specialty comments available.  Imaging: No results found.   PMFS History: Patient Active Problem List   Diagnosis Date Noted   Plantar fasciitis 12/25/2022   Entrapment of right ulnar nerve 12/25/2022   Ganglion cyst of dorsum of right wrist 12/25/2022   Pain of left hip 12/25/2022   Bone spur of other site 12/25/2022   Primary osteoarthritis of both knees 12/25/2022   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   Breast pain, left 12/25/2022   Tightness of left heel cord 09/05/2021   SI (sacroiliac) pain 11/01/2019   Spondylosis without myelopathy or radiculopathy, cervical region 08/02/2019   Ulnar neuropathy of left upper extremity 01/08/2019   Tendinitis of left rotator cuff 01/08/2019   Lumbar radiculopathy 12/11/2018   Patellofemoral stress syndrome of left knee 09/04/2018   Pure hypercholesterolemia 04/06/2018   Class 3 severe obesity due to excess calories with serious comorbidity and body mass index (BMI) of 40.0 to 44.9 in adult (HCC) 02/19/2018   Abnormal cervical Pap smear with positive HPV DNA test 05/10/2015   Reactive depression 04/19/2015   Chronic asthma without complication 04/18/2015   Anaphylactic reaction  04/12/2015   Past Medical History:  Diagnosis Date   Asthma with exacerbation 09/01/2014   Depression    Headache    History of laparoscopic adjustable gastric banding-done in [place] 01/26/2014   No info on type of band   Hypoxia    S/P gastric surgery 09/01/2014   Status post laparoscopic cholecystectomy August 2015 01/28/2014    Family History  Adopted: Yes  Family history unknown: Yes    Past Surgical History:  Procedure Laterality Date   ABDOMINAL HYSTERECTOMY     CHOLECYSTECTOMY N/A 01/28/2014   Procedure: LAPAROSCOPIC CHOLECYSTECTOMY WITH INTRAOPERATIVE CHOLANGIOGRAM;  Surgeon: Valarie Merino, MD;  Location: [facility];  Service: General;  Laterality: N/A;   KNEE SURGERY     LAPAROSCOPIC GASTRIC BANDING N/A 2013   performed in [place]   TONSILLECTOMY     Social History   Occupational History   Not on file  Tobacco Use   Smoking status: Never   Smokeless tobacco: Never  Substance and Sexual Activity   Alcohol use: Yes    Comment: social on occ   Drug use: No  Sexual activity: Yes    Birth control/protection: Surgical

## 2023-01-27 ENCOUNTER — Ambulatory Visit: Payer: PRIVATE HEALTH INSURANCE | Admitting: Orthopedic Surgery

## 2023-01-27 DIAGNOSIS — M67431 Ganglion, right wrist: Secondary | ICD-10-CM | POA: Diagnosis not present

## 2023-01-27 NOTE — Progress Notes (Signed)
Melissa Dodson - 52 y.o. female MRN [id number]  Date of birth: 21-Sep-1970  Office Visit Note: Visit Date: 01/27/2023 PCP: Salvatore Decent, FNP Referred by: Salvatore Decent, FNP  Subjective: Chief Complaint  Patient presents with   Right Wrist - Pain   HPI: Melissa Dodson is a pleasant 52 y.o. female who presents today for right wrist pain associated with a volar mass over the radial aspect does been present for multiple years.  She states that is becoming more symptomatic pain wise over the past 6 months.  She also demonstrates numbness and tingling radiating from the elbow down to the digits.  She is describing numbness in the radial and ulnar sided digits.  [facility] at the elbow, there was evidence of mild cubital tunnel at the time which has progressed in her mind symptomatically over the past year.  She has tried towel splinting and padding for the right elbow to help with ongoing symptoms at this region.  Visit Reason: right wrist volar cyst  Hand dominance: right Occupation: Student Diabetic: No Heart/Lung History: none Blood Thinners: none  Prior Testing/EMG: xray 12/29/22 Injections (Date): none Treatments:  ibuprofen  Prior Surgery: none   *painful- years but has gotten bigger* *nerve pain at elbow- worsening* *numbness/pain down arm*  Pertinent ROS were reviewed with the patient and found to be negative unless otherwise specified above in HPI.   Assessment & Plan: Visit Diagnoses:  1. Ganglion cyst of volar aspect of right wrist     Plan: Extensive discussion was had the patient today regarding her right upper extremity complaints.  For the mass of the volar aspect of the radial wrist, I would like to obtain an MRI study to better delineate size and structure of the mass for potential surgical planning purposes.  Will attempt to obtain her prior EMG study of the right upper extremity as well, as she is  demonstrating evidence of right-sided carpal tunnel and cubital tunnel syndrome.  She will return to me in the near future for follow-up regarding the right wrist MRI, at that juncture we will also discuss potential nerve treatment options as well given her ongoing symptoms.    Follow-up: Return for After MRI.   Meds & Orders: No orders of the defined types were placed in this encounter.   Orders Placed This Encounter  Procedures   MR Wrist Right w/o contrast   MR HAND RIGHT WO CONTRAST     Procedures: No procedures performed      Clinical History: No specialty comments available.  She reports that she has never smoked. She has never used smokeless tobacco. No results for input(s): "HGBA1C", "LABURIC" in the last 8760 hours.  Objective:   Vital Signs: There were no vitals taken for this visit.  Physical Exam  Gen: Well-appearing, in no acute distress; non-toxic CV: Regular Rate. Well-perfused. Warm.  Resp: Breathing unlabored on room air; no wheezing. Psych: Fluid speech in conversation; appropriate affect; normal thought process Neuro: Sensation intact throughout. No gross coordination deficits.   Ortho Exam PHYSICAL EXAM:  General: Patient is well appearing and in no distress. Cervical spine mobility is full in all directions:  Skin and Muscle: No skin changes are apparent to upper extremities.   Range of Motion and Palpation Tests: Mobility is full about the elbows with flexion and extension.  No obvious evidence of nerve subluxation at right elbow.  Forearm supination and pronation are 85/85 bilaterally.  Wrist flexion/extension is 75/65 bilaterally.  Digital flexion and extension are full.  Thumb opposition is full to the base of the small fingers bilaterally.    Palpable mass of the volar radial aspect of the right wrist, measures approximately 0.5 cm x 0.5 cm, tender to palpation, mobile  Allen's testing does demonstrate appropriate collateral flow with both radial  and ulnar at the level of the right wrist   Neurologic, Vascular, Motor: Sensation is diminished to light touch in the median/ulnar distributions hand compared to left.    Tinel's testing positive right wrist, significantly positive right cubital tunnel Phalen's positive right, Derkan's compression positive right Thenar atrophy: None APB: 5/5 bilaterally, small finger FDP 5/5  Fingers pink and well perfused.  Capillary refill is brisk.     Lab Results  Component Value Date   HGBA1C 5.5 04/19/2015     Imaging: No results found.  Past Medical/Family/Surgical/Social History: Medications & Allergies reviewed per EMR, new medications updated. Patient Active Problem List   Diagnosis Date Noted   Plantar fasciitis 12/25/2022   Entrapment of right ulnar nerve 12/25/2022   Ganglion cyst of dorsum of right wrist 12/25/2022   Pain of left hip 12/25/2022   Bone spur of other site 12/25/2022   Primary osteoarthritis of both knees 12/25/2022   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   Breast pain, left 12/25/2022   Tightness of left heel cord 09/05/2021   SI (sacroiliac) pain 11/01/2019   Spondylosis without myelopathy or radiculopathy, cervical region 08/02/2019   Ulnar neuropathy of left upper extremity 01/08/2019   Tendinitis of left rotator cuff 01/08/2019   Lumbar radiculopathy 12/11/2018   Patellofemoral stress syndrome of left knee 09/04/2018   Pure hypercholesterolemia 04/06/2018   Class 3 severe obesity due to excess calories with serious comorbidity and body mass index (BMI) of 40.0 to 44.9 in adult (HCC) 02/19/2018   Abnormal cervical Pap smear with positive HPV DNA test 05/10/2015   Reactive depression 04/19/2015   Chronic asthma without complication 04/18/2015   Anaphylactic reaction 04/12/2015   Past Medical History:  Diagnosis Date   Asthma with exacerbation 09/01/2014   Depression    Headache    History of laparoscopic adjustable  gastric banding-done in [place] 01/26/2014   No info on type of band   Hypoxia    S/P gastric surgery 09/01/2014   Status post laparoscopic cholecystectomy August 2015 01/28/2014   Family History  Adopted: Yes  Family history unknown: Yes   Past Surgical History:  Procedure Laterality Date   ABDOMINAL HYSTERECTOMY     CHOLECYSTECTOMY N/A 01/28/2014   Procedure: LAPAROSCOPIC CHOLECYSTECTOMY WITH INTRAOPERATIVE CHOLANGIOGRAM;  Surgeon: Valarie Merino, MD;  Location: [facility];  Service: General;  Laterality: N/A;   KNEE SURGERY     LAPAROSCOPIC GASTRIC BANDING N/A 2013   performed in [place]   TONSILLECTOMY     Social History   Occupational History   Not on file  Tobacco Use   Smoking status: Never   Smokeless tobacco: Never  Substance and Sexual Activity   Alcohol use: Yes    Comment: social on occ   Drug use: No   Sexual activity: Yes    Birth control/protection: Surgical    Rickard Kennerly Fara Boros) Denese Killings, M.D. [facility] 10:59 AM

## 2023-01-28 ENCOUNTER — Ambulatory Visit: Payer: Commercial Managed Care - HMO

## 2023-02-05 ENCOUNTER — Ambulatory Visit: Payer: Commercial Managed Care - HMO

## 2023-02-17 ENCOUNTER — Ambulatory Visit
Admission: EM | Admit: 2023-02-17 | Discharge: 2023-02-17 | Disposition: A | Discharge status: Home / Self Care | Payer: Commercial Managed Care - HMO | Attending: Family Medicine | Admitting: Family Medicine

## 2023-02-17 DIAGNOSIS — S61452A Open bite of left hand, initial encounter: Secondary | ICD-10-CM | POA: Diagnosis not present

## 2023-02-17 DIAGNOSIS — S61432A Puncture wound without foreign body of left hand, initial encounter: Secondary | ICD-10-CM

## 2023-02-17 DIAGNOSIS — W540XXA Bitten by dog, initial encounter: Secondary | ICD-10-CM

## 2023-02-17 DIAGNOSIS — M79642 Pain in left hand: Secondary | ICD-10-CM | POA: Diagnosis not present

## 2023-02-17 DIAGNOSIS — S61259A Open bite of unspecified finger without damage to nail, initial encounter: Secondary | ICD-10-CM | POA: Diagnosis not present

## 2023-02-17 MED ORDER — AMOXICILLIN-POT CLAVULANATE 875-125 MG PO TABS: 1.0000 | ORAL_TABLET | Freq: Two times a day (BID) | ORAL | 0 refills | Status: DC

## 2023-02-17 MED ORDER — HYDROCODONE-ACETAMINOPHEN 5-325 MG PO TABS: 1.0000 | ORAL_TABLET | Freq: Three times a day (TID) | ORAL | 0 refills | Status: DC | PRN

## 2023-02-17 NOTE — Discharge Instructions (Addendum)
Advised patient to take medication as directed with food to completion.  Advised patient may take Norco every 8 hours for breakthrough left hand pain.  Patient aware of sedate of effects of this medication.  Encouraged to increase daily water intake to 64 ounces per day while taking these medications.  Advised if symptoms worsen and/or unresolved please follow-up with PCP or here for further evaluation.

## 2023-02-17 NOTE — ED Triage Notes (Signed)
Pt presents s/p dog bite to Left hand. Pt attempted to catch her neighbor's dog after it got out, and the dog bit her hand. Punctures wounds x3 palm and x1 to back of the hand. Reports pain with moving fingers, and that pain radiates to elbow. One puncture wound to R pinky finger. Pt has been unable to get in touch with the owner's to determine the dog's vaccination status. Pt's tetanus is UTD.

## 2023-02-17 NOTE — ED Provider Notes (Signed)
[facility]    CSN: [id number] Arrival date & time: 02/17/23  1506      History   Chief Complaint Chief Complaint  Patient presents with   Animal Bite    HPI Melissa Dodson is a 52 y.o. female.   HPI 52 year old female presents with dog bite wounds of right hand (palmar and dorsum) patient reports earlier this morning next-door neighbor's [place]  came into her backyard. Patient reports she has 2 Rottweiler's; patient reports picking up neighbor's [place] so that her dogs would not harm the neighbors dog.  Patient reports Jersey bit her right hand and 3 areas (2 in palmar surface 1 in back of hand)      Past Medical History:  Diagnosis Date   Asthma with exacerbation 09/01/2014   Depression    Headache    History of laparoscopic adjustable gastric banding-done in [place] 01/26/2014   No info on type of band   Hypoxia    S/P gastric surgery 09/01/2014   Status post laparoscopic cholecystectomy August 2015 01/28/2014    Patient Active Problem List   Diagnosis Date Noted   Plantar fasciitis 12/25/2022   Entrapment of right ulnar nerve 12/25/2022   Ganglion cyst of dorsum of right wrist 12/25/2022   Pain of left hip 12/25/2022   Bone spur of other site 12/25/2022   Primary osteoarthritis of both knees 12/25/2022   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   Breast pain, left 12/25/2022   Tightness of left heel cord 09/05/2021   SI (sacroiliac) pain 11/01/2019   Spondylosis without myelopathy or radiculopathy, cervical region 08/02/2019   Ulnar neuropathy of left upper extremity 01/08/2019   Tendinitis of left rotator cuff 01/08/2019   Lumbar radiculopathy 12/11/2018   Patellofemoral stress syndrome of left knee 09/04/2018   Pure hypercholesterolemia 04/06/2018   Class 3 severe obesity due to excess calories with serious comorbidity and body mass index (BMI) of 40.0 to 44.9 in adult (HCC) 02/19/2018   Abnormal cervical Pap  smear with positive HPV DNA test 05/10/2015   Reactive depression 04/19/2015   Chronic asthma without complication 04/18/2015   Anaphylactic reaction 04/12/2015    Past Surgical History:  Procedure Laterality Date   ABDOMINAL HYSTERECTOMY     CHOLECYSTECTOMY N/A 01/28/2014   Procedure: LAPAROSCOPIC CHOLECYSTECTOMY WITH INTRAOPERATIVE CHOLANGIOGRAM;  Surgeon: Valarie Merino, MD;  Location: [facility];  Service: General;  Laterality: N/A;   KNEE SURGERY     LAPAROSCOPIC GASTRIC BANDING N/A 2013   performed in [place]   TONSILLECTOMY      OB History     Gravida  2   Para  2   Term  1   Preterm  1   AB      Living         SAB      IAB      Ectopic      Multiple      Live Births               Home Medications    Prior to Admission medications   Medication Sig Start Date End Date Taking? Authorizing Provider  amoxicillin-clavulanate (AUGMENTIN) 875-125 MG tablet Take 1 tablet by mouth every 12 (twelve) hours. 02/17/23  Yes Trevor Iha, FNP  amphetamine-dextroamphetamine (ADDERALL) 20 MG tablet Take 1 tablet (20 mg total) by mouth 2 (two) times daily. 01/23/23  Yes Salvatore Decent, FNP  BIOTIN PO Take by mouth daily.   Yes [provider]  HYDROcodone-acetaminophen (NORCO/VICODIN) 5-325 MG tablet Take 1 tablet by mouth every 8 (eight) hours as needed for up to 7 days. 02/17/23 02/24/23 Yes Trevor Iha, FNP  amphetamine-dextroamphetamine (ADDERALL) 20 MG tablet Take 1 tablet (20 mg total) by mouth 2 (two) times daily. 03/25/23   Salvatore Decent, FNP  amphetamine-dextroamphetamine (ADDERALL) 20 MG tablet Take 1 tablet (20 mg total) by mouth 2 (two) times daily. 02/23/23   Salvatore Decent, FNP  EPINEPHrine 0.3 mg/0.3 mL IJ SOAJ injection Inject 0.3 mLs (0.3 mg total) into the muscle once. 04/16/15   Lonia Blood, MD  Semaglutide-Weight Management 0.25 MG/0.5ML SOAJ Inject 0.25mg  into skin once weekly for 4 weeks. Then increase dose to 0.5mg  once weekly. 12/26/22    Salvatore Decent, FNP    Family History Family History  Adopted: Yes  Family history unknown: Yes    Social History Social History   Tobacco Use   Smoking status: Never   Smokeless tobacco: Never  Vaping Use   Vaping status: Never Used  Substance Use Topics   Alcohol use: Yes    Comment: social on occ   Drug use: No     Allergies   Nystatin, Mango flavor, Mangifera indica, and Sulfa antibiotics   Review of Systems Review of Systems  Skin:  Positive for wound.     Physical Exam Triage Vital Signs ED Triage Vitals  Encounter Vitals Group     BP 02/17/23 1533 135/83     Systolic BP Percentile --      Diastolic BP Percentile --      Pulse Rate 02/17/23 1533 (!) 108     Resp 02/17/23 1533 18     Temp 02/17/23 1533 (!) 97.5 F (36.4 C)     Temp Source 02/17/23 1533 Oral     SpO2 02/17/23 1533 97 %     Weight --      Height --      Head Circumference --      Peak Flow --      Pain Score 02/17/23 1526 10     Pain Loc --      Pain Education --      Exclude from Growth Chart --    No data found.  Updated Vital Signs BP 135/83 (BP Location: Right Arm)   Pulse (!) 108   Temp (!) 97.5 F (36.4 C) (Oral)   Resp 18   SpO2 97%      Physical Exam Vitals and nursing note reviewed.  Constitutional:      Appearance: Normal appearance. She is normal weight.  HENT:     Head: Normocephalic and atraumatic.     Right Ear: Tympanic membrane, ear canal and external ear normal.     Left Ear: Tympanic membrane, ear canal and external ear normal.     Mouth/Throat:     Mouth: Mucous membranes are moist.     Pharynx: Oropharynx is clear.  Eyes:     Extraocular Movements: Extraocular movements intact.     Conjunctiva/sclera: Conjunctivae normal.     Pupils: Pupils are equal, round, and reactive to light.  Cardiovascular:     Rate and Rhythm: Normal rate and regular rhythm.     Pulses: Normal pulses.     Heart sounds: Normal heart sounds.  Pulmonary:     Effort:  Pulmonary effort is normal.     Breath sounds: Normal breath sounds. No wheezing, rhonchi or rales.  Musculoskeletal:        General: Normal  range of motion.     Cervical back: Normal range of motion and neck supple.  Skin:    General: Skin is warm and dry.     Comments: Left hand (dorsum/palmar aspects): 1 puncture wound to dorsum, 3 puncture wounds to palmar aspect-please see images below; right fifth finger (palmar aspect of DIP) 1 small puncture wound as well  Neurological:     General: No focal deficit present.     Mental Status: She is alert and oriented to person, place, and time. Mental status is at baseline.  Psychiatric:        Mood and Affect: Mood normal.        Behavior: Behavior normal.        Thought Content: Thought content normal.           UC Treatments / Results  Labs (all labs ordered are listed, but only abnormal results are displayed) Labs Reviewed - No data to display  EKG   Radiology No results found.  Procedures Procedures (including critical care time)  Medications Ordered in UC Medications - No data to display  Initial Impression / Assessment and Plan / UC Course  I have reviewed the triage vital signs and the nursing notes.  Pertinent labs & imaging results that were available during my care of the patient were reviewed by me and considered in my medical decision making (see chart for details).     MDM: 1.  Dog bite of left hand, initial encounter-puncture wounds of left hand and right ring finger documented via pictures in patient's chart initial dog bite wounds applied with let gel for pain and soaked in Hibiclens and warm water; 2.  Puncture wound of left hand without foreign body, initial encounter-Rx'd Augmentin 875/125 mg tablet twice daily x 7 days; 3.  Dog bite of finger, initial encounter-right fifth DIP palmar aspect also captured with image; 4.  Left hand pain-Rx'd Norco 5/325 mg tablet: Take 1 tablet every 8 hours for  breakthrough left hand pain. Advised patient to take medication as directed with food to completion.  Advised patient may take Norco every 8 hours for breakthrough left hand pain.  Patient aware of sedate of effects of this medication.  Encouraged to increase daily water intake to 64 ounces per day while taking these medications.  Advised if symptoms worsen and/or unresolved please follow-up with PCP or here for further evaluation.  Patient discharged home, hemodynamically stable.  Final Clinical Impressions(s) / UC Diagnoses   Final diagnoses:  Dog bite of left hand, initial encounter  Puncture wound of left hand without foreign body, initial encounter  Dog bite of finger, initial encounter  Left hand pain     Discharge Instructions      Advised patient to take medication as directed with food to completion.  Advised patient may take Norco every 8 hours for breakthrough left hand pain.  Patient aware of sedate of effects of this medication.  Encouraged to increase daily water intake to 64 ounces per day while taking these medications.  Advised if symptoms worsen and/or unresolved please follow-up with PCP or here for further evaluation.     ED Prescriptions     Medication Sig Dispense Auth. Provider   amoxicillin-clavulanate (AUGMENTIN) 875-125 MG tablet Take 1 tablet by mouth every 12 (twelve) hours. 14 tablet Trevor Iha, FNP   HYDROcodone-acetaminophen (NORCO/VICODIN) 5-325 MG tablet Take 1 tablet by mouth every 8 (eight) hours as needed for up to 7 days. 21 tablet Dimitria Ketchum,  Casimiro Needle, FNP      I have reviewed the PDMP during this encounter.   Trevor Iha, FNP 02/17/23 1622

## 2023-02-18 ENCOUNTER — Telehealth: Payer: Self-pay | Admitting: Family Medicine

## 2023-02-18 MED ORDER — LIDOCAINE 4 % EX PTCH: 1.0000 | MEDICATED_PATCH | CUTANEOUS | 0 refills | Status: DC

## 2023-02-18 NOTE — Telephone Encounter (Signed)
Patient request lidocaine patch for dog bites.  Reports Norco is not effective.  Lidocaine patches sent to patient's pharmacy per request.

## 2023-02-19 ENCOUNTER — Encounter: Payer: Self-pay | Admitting: Internal Medicine

## 2023-02-19 ENCOUNTER — Ambulatory Visit: Payer: Commercial Managed Care - HMO | Admitting: Internal Medicine

## 2023-02-19 ENCOUNTER — Other Ambulatory Visit: Payer: Self-pay

## 2023-02-19 ENCOUNTER — Ambulatory Visit
Admission: EM | Admit: 2023-02-19 | Discharge: 2023-02-19 | Disposition: A | Discharge status: Home / Self Care | Payer: Commercial Managed Care - HMO | Attending: Family Medicine | Admitting: Family Medicine

## 2023-02-19 ENCOUNTER — Encounter: Payer: Self-pay | Admitting: *Deleted

## 2023-02-19 VITALS — BP 136/86 | HR 89 | Temp 98.1°F | Ht 70.0 in | Wt 251.2 lb

## 2023-02-19 DIAGNOSIS — Z203 Contact with and (suspected) exposure to rabies: Secondary | ICD-10-CM | POA: Diagnosis not present

## 2023-02-19 DIAGNOSIS — T07XXXA Unspecified multiple injuries, initial encounter: Secondary | ICD-10-CM

## 2023-02-19 DIAGNOSIS — W540XXD Bitten by dog, subsequent encounter: Secondary | ICD-10-CM | POA: Diagnosis not present

## 2023-02-19 DIAGNOSIS — L03114 Cellulitis of left upper limb: Secondary | ICD-10-CM

## 2023-02-19 DIAGNOSIS — W540XXA Bitten by dog, initial encounter: Secondary | ICD-10-CM | POA: Diagnosis not present

## 2023-02-19 DIAGNOSIS — Z23 Encounter for immunization: Secondary | ICD-10-CM | POA: Diagnosis not present

## 2023-02-19 DIAGNOSIS — S61452A Open bite of left hand, initial encounter: Secondary | ICD-10-CM | POA: Diagnosis not present

## 2023-02-19 MED ORDER — OXYCODONE-ACETAMINOPHEN 5-325 MG PO TABS: 1.0000 | ORAL_TABLET | Freq: Four times a day (QID) | ORAL | 0 refills | Status: AC | PRN

## 2023-02-19 MED ORDER — DOXYCYCLINE HYCLATE 100 MG PO CAPS: 100.0000 mg | ORAL_CAPSULE | Freq: Two times a day (BID) | ORAL | 0 refills | Status: DC

## 2023-02-19 MED ORDER — RABIES VACCINE, PCEC IM SUSR
1.0000 mL | Freq: Once | INTRAMUSCULAR | Status: AC
  Administered 2023-02-19: 1 mL via INTRAMUSCULAR

## 2023-02-19 MED ORDER — RABIES IMMUNE GLOBULIN 150 UNIT/ML IM INJ
18.5000 [IU]/kg | INJECTION | Freq: Once | INTRAMUSCULAR | Status: AC
  Administered 2023-02-19: 2100 [IU]

## 2023-02-19 MED ORDER — KETOROLAC TROMETHAMINE 30 MG/ML IJ SOLN
30.0000 mg | Freq: Once | INTRAMUSCULAR | Status: AC
  Administered 2023-02-19: 30 mg via INTRAMUSCULAR

## 2023-02-19 NOTE — Progress Notes (Signed)
[facility] [address] [place] [address] Dept: [phone] Dept Fax: [phone]  Acute Care Office Visit  Subjective:   Melissa Dodson 05-07-1971 02/19/2023  Chief Complaint  Patient presents with   Animal Bite    Happened on Monday     HPI: Melissa Dodson is a 52 yo F who presents with dog bite to left hand on 02/17/23 by her neighbor's [place].  Patient states the neighbors dog came into her yard, she went to pick up the dog because she has 2 Rottweiler's and was afraid they would hurt the neighbors [place].  When she picked up the dog, it bit her on the left hand.  Patient did go to UC, was given augmentin and hydrocodone. No XR's conducted.  Patient has been taking antibiotic as directed. She reports the hydrocodone barely touches her pain.  The neighbors are currently out of the country, patient is unsure if the dog is up-to-date on vaccines.  She states the dog was not acting abnormal at the time she was bitten, but she has not seen the dog since. Patient is Artist.  She did not receive rabies vaccines at the time of her urgent care visit. Today, she reports that her right hand is hot to the touch and very painful. Worse than initial time of injury. She is unable to grip objects due to significant pain.  She also reports feeling fatigued, subjective fevers, headaches, dizziness, and decreased appetite.  No nausea or vomiting.   The following portions of the patient's history were reviewed and updated as appropriate: past medical history, past surgical history, family history, social history, allergies, medications, and problem list.   Patient Active Problem List   Diagnosis Date Noted   Plantar fasciitis 12/25/2022   Entrapment of right ulnar nerve 12/25/2022   Ganglion cyst of dorsum of right wrist 12/25/2022   Pain of left hip 12/25/2022   Bone spur of other site 12/25/2022   Primary  osteoarthritis of both knees 12/25/2022   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   Breast pain, left 12/25/2022   Tightness of left heel cord 09/05/2021   SI (sacroiliac) pain 11/01/2019   Spondylosis without myelopathy or radiculopathy, cervical region 08/02/2019   Ulnar neuropathy of left upper extremity 01/08/2019   Tendinitis of left rotator cuff 01/08/2019   Lumbar radiculopathy 12/11/2018   Patellofemoral stress syndrome of left knee 09/04/2018   Pure hypercholesterolemia 04/06/2018   Class 3 severe obesity due to excess calories with serious comorbidity and body mass index (BMI) of 40.0 to 44.9 in adult (HCC) 02/19/2018   Abnormal cervical Pap smear with positive HPV DNA test 05/10/2015   Reactive depression 04/19/2015   Chronic asthma without complication 04/18/2015   Anaphylactic reaction 04/12/2015   Past Medical History:  Diagnosis Date   Asthma with exacerbation 09/01/2014   Depression    Headache    History of laparoscopic adjustable gastric banding-done in [place] 01/26/2014   No info on type of band   Hypoxia    S/P gastric surgery 09/01/2014   Status post laparoscopic cholecystectomy August 2015 01/28/2014   Past Surgical History:  Procedure Laterality Date   ABDOMINAL HYSTERECTOMY     CHOLECYSTECTOMY N/A 01/28/2014   Procedure: LAPAROSCOPIC CHOLECYSTECTOMY WITH INTRAOPERATIVE CHOLANGIOGRAM;  Surgeon: Valarie Merino, MD;  Location: [facility];  Service: General;  Laterality: N/A;   KNEE SURGERY     LAPAROSCOPIC GASTRIC BANDING N/A 2013   performed in  [place]   TONSILLECTOMY     Family History  Adopted: Yes  Family history unknown: Yes    Current Outpatient Medications:    amoxicillin-clavulanate (AUGMENTIN) 875-125 MG tablet, Take 1 tablet by mouth every 12 (twelve) hours., Disp: 14 tablet, Rfl: 0   amphetamine-dextroamphetamine (ADDERALL) 20 MG tablet, Take 1 tablet (20 mg total) by mouth 2 (two) times daily., Disp: 60 tablet, Rfl:  0   BIOTIN PO, Take by mouth daily., Disp: , Rfl:    EPINEPHrine 0.3 mg/0.3 mL IJ SOAJ injection, Inject 0.3 mLs (0.3 mg total) into the muscle once., Disp: 1 Device, Rfl: 1   oxyCODONE-acetaminophen (PERCOCET/ROXICET) 5-325 MG tablet, Take 1 tablet by mouth every 6 (six) hours as needed for up to 5 days for severe pain., Disp: 20 tablet, Rfl: 0   [START ON 03/25/2023] amphetamine-dextroamphetamine (ADDERALL) 20 MG tablet, Take 1 tablet (20 mg total) by mouth 2 (two) times daily., Disp: 60 tablet, Rfl: 0   [START ON 02/23/2023] amphetamine-dextroamphetamine (ADDERALL) 20 MG tablet, Take 1 tablet (20 mg total) by mouth 2 (two) times daily., Disp: 60 tablet, Rfl: 0   doxycycline (VIBRAMYCIN) 100 MG capsule, Take 1 capsule (100 mg total) by mouth 2 (two) times daily., Disp: 20 capsule, Rfl: 0   Semaglutide-Weight Management 0.25 MG/0.5ML SOAJ, Inject 0.25mg  into skin once weekly for 4 weeks. Then increase dose to 0.5mg  once weekly., Disp: 2 mL, Rfl: 2 Allergies  Allergen Reactions   Nystatin Anaphylaxis and Swelling    Patient broke out in rash on extremities, had swelling of the nasal passage, throat and lips. Reaction was increasing over the course of 1-3 days.   Mango Flavor Swelling   Sulfa Antibiotics Hives     ROS: A complete ROS was performed with pertinent positives/negatives noted in the HPI. The remainder of the ROS are negative.    Objective:   Today's Vitals   02/19/23 1010 02/19/23 1037  BP: 136/86   Pulse: 89   Temp: 98.5 F (36.9 C) 98.1 F (36.7 C)  TempSrc: Temporal Oral  SpO2: 99%   Weight: 251 lb 3.2 oz (113.9 kg)   Height: 5\' 10"  (1.778 m)     GENERAL: ill-appearing, non-toxic. in NAD. Well nourished.  SKIN: Pink, warm and dry.  Single blood blister to palmar aspect of right fifth finger of DIP, 3 puncture wounds to thenar surface of left hand with surrounding redness,warmth, and swelling.  No streaking to left forearm.  No purulence from puncture wounds. NECK:  Trachea midline. Full ROM w/o pain or tenderness. No lymphadenopathy.  RESPIRATORY: Chest wall symmetrical. Respirations even and non-labored. Breath sounds clear to auscultation bilaterally.  CARDIAC: S1, S2 present, regular rate and rhythm. Peripheral pulses 2+ bilaterally.  EXTREMITIES: Without clubbing, cyanosis, or edema.  NEUROLOGIC: No sensory deficits. Steady, even gait. Grip strength 2/5 secondary to pain and swelling of left hand from injury PSYCH/MENTAL STATUS: Alert, oriented x 3. Cooperative, appropriate mood and affect.   No results found for any visits on 02/19/23.    Assessment & Plan:  1. Dog bite, subsequent encounter - oxyCODONE-acetaminophen (PERCOCET/ROXICET) 5-325 MG tablet; Take 1 tablet by mouth every 6 (six) hours as needed for up to 5 days for severe pain.  Dispense: 20 tablet; Refill: 0 - continue Augmentin as prescribed -Case was consulted with Dr. Farris Has, who concurred that patient needs to go to the emergency room for rabies vaccinations  Discussed recomendations with patient who agreed and will proceed to ER for further treatment.  Meds ordered this encounter  Medications   oxyCODONE-acetaminophen (PERCOCET/ROXICET) 5-325 MG tablet    Sig: Take 1 tablet by mouth every 6 (six) hours as needed for up to 5 days for severe pain.    Dispense:  20 tablet    Refill:  0    Order Specific Question:   Supervising Provider    Answer:   Garnette Gunner [[id number]]   Lab Orders  No laboratory test(s) ordered today   No images are attached to the encounter or orders placed in the encounter.  Return if symptoms worsen or fail to improve.   Salvatore Decent, FNP

## 2023-02-19 NOTE — ED Triage Notes (Signed)
Pt seen here Monday for dog bite. She has been unable to get records from the dog's owners despite multiple attempts and has been advised to get the rabies vaccine by her PCP. Pt's left hand is red and swollen around the puncture sites

## 2023-02-19 NOTE — ED Provider Notes (Addendum)
[facility]    CSN: [id number] Arrival date & time: 02/19/23  1121      History   Chief Complaint Chief Complaint  Patient presents with   Animal Bite    Follow-up appt    HPI Melissa Dodson is a 52 y.o. female.   Refer to note 2 days ago Patient has a dog bite.  She is taking Augmentin.  Is still very swollen and painful. A police report was made at the time she was seen last.  [facility] has her own animal control.  They are investigating the dog for vaccination status. Patient saw her PCP today who insisted that she come back to the office for rabies prophylaxis and vaccination Ahmina states that finding an evaluating the dog is going to be difficult because the family states the dog is run away.  Family states that the dog's owners are in a [place] jail.  It is difficult to get any information from the dog sitters or the family given the circumstances.  Animal control was contacted and the officer states she will call this patient later today with an update There is no known rabies and at domestic animal in [facility] for over 10 years    Past Medical History:  Diagnosis Date   Asthma with exacerbation 09/01/2014   Depression    Headache    History of laparoscopic adjustable gastric banding-done in [place] 01/26/2014   No info on type of band   Hypoxia    S/P gastric surgery 09/01/2014   Status post laparoscopic cholecystectomy August 2015 01/28/2014    Patient Active Problem List   Diagnosis Date Noted   Plantar fasciitis 12/25/2022   Entrapment of right ulnar nerve 12/25/2022   Ganglion cyst of dorsum of right wrist 12/25/2022   Pain of left hip 12/25/2022   Bone spur of other site 12/25/2022   Primary osteoarthritis of both knees 12/25/2022   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   Breast pain, left 12/25/2022   Tightness of left heel cord 09/05/2021   SI (sacroiliac) pain 11/01/2019   Spondylosis without  myelopathy or radiculopathy, cervical region 08/02/2019   Ulnar neuropathy of left upper extremity 01/08/2019   Tendinitis of left rotator cuff 01/08/2019   Lumbar radiculopathy 12/11/2018   Patellofemoral stress syndrome of left knee 09/04/2018   Pure hypercholesterolemia 04/06/2018   Class 3 severe obesity due to excess calories with serious comorbidity and body mass index (BMI) of 40.0 to 44.9 in adult (HCC) 02/19/2018   Abnormal cervical Pap smear with positive HPV DNA test 05/10/2015   Reactive depression 04/19/2015   Chronic asthma without complication 04/18/2015   Anaphylactic reaction 04/12/2015    Past Surgical History:  Procedure Laterality Date   ABDOMINAL HYSTERECTOMY     CHOLECYSTECTOMY N/A 01/28/2014   Procedure: LAPAROSCOPIC CHOLECYSTECTOMY WITH INTRAOPERATIVE CHOLANGIOGRAM;  Surgeon: Valarie Merino, MD;  Location: [facility];  Service: General;  Laterality: N/A;   KNEE SURGERY     LAPAROSCOPIC GASTRIC BANDING N/A 2013   performed in [place]   TONSILLECTOMY      OB History     Gravida  2   Para  2   Term  1   Preterm  1   AB      Living         SAB      IAB      Ectopic      Multiple      Live  Births               Home Medications    Prior to Admission medications   Medication Sig Start Date End Date Taking? Authorizing Provider  doxycycline (VIBRAMYCIN) 100 MG capsule Take 1 capsule (100 mg total) by mouth 2 (two) times daily. 02/19/23  Yes Eustace Moore, MD  amoxicillin-clavulanate (AUGMENTIN) 875-125 MG tablet Take 1 tablet by mouth every 12 (twelve) hours. 02/17/23   Trevor Iha, FNP  amphetamine-dextroamphetamine (ADDERALL) 20 MG tablet Take 1 tablet (20 mg total) by mouth 2 (two) times daily. 01/23/23   Salvatore Decent, FNP  amphetamine-dextroamphetamine (ADDERALL) 20 MG tablet Take 1 tablet (20 mg total) by mouth 2 (two) times daily. 03/25/23   Salvatore Decent, FNP  amphetamine-dextroamphetamine (ADDERALL) 20 MG tablet Take 1 tablet (20  mg total) by mouth 2 (two) times daily. 02/23/23   Salvatore Decent, FNP  BIOTIN PO Take by mouth daily.    [provider]  EPINEPHrine 0.3 mg/0.3 mL IJ SOAJ injection Inject 0.3 mLs (0.3 mg total) into the muscle once. 04/16/15   Lonia Blood, MD  oxyCODONE-acetaminophen (PERCOCET/ROXICET) 5-325 MG tablet Take 1 tablet by mouth every 6 (six) hours as needed for up to 5 days for severe pain. 02/19/23 02/24/23  Salvatore Decent, FNP  Semaglutide-Weight Management 0.25 MG/0.5ML SOAJ Inject 0.25mg  into skin once weekly for 4 weeks. Then increase dose to 0.5mg  once weekly. 12/26/22   Salvatore Decent, FNP    Family History Family History  Adopted: Yes  Family history unknown: Yes    Social History Social History   Tobacco Use   Smoking status: Never   Smokeless tobacco: Never  Vaping Use   Vaping status: Never Used  Substance Use Topics   Alcohol use: Yes    Comment: social on occ   Drug use: No     Allergies   Nystatin, Mango flavor, and Sulfa antibiotics   Review of Systems Review of Systems See HPI  Physical Exam Triage Vital Signs ED Triage Vitals  Encounter Vitals Group     BP 02/19/23 1151 130/84     Systolic BP Percentile --      Diastolic BP Percentile --      Pulse Rate 02/19/23 1151 82     Resp 02/19/23 1151 18     Temp 02/19/23 1151 97.7 F (36.5 C)     Temp Source 02/19/23 1151 Oral     SpO2 02/19/23 1151 97 %     Weight 02/19/23 1153 251 lb 1.7 oz (113.9 kg)     Height --      Head Circumference --      Peak Flow --      Pain Score 02/19/23 1153 9     Pain Loc --      Pain Education --      Exclude from Growth Chart --    No data found.  Updated Vital Signs BP 130/84 (BP Location: Right Arm)   Pulse 82   Temp 97.7 F (36.5 C) (Oral)   Resp 18   Wt 113.3 kg   SpO2 97%   BMI 35.84 kg/m      Physical Exam Constitutional:      General: She is not in acute distress.    Appearance: She is well-developed. She is obese.     Comments:  Patient cradles left hand close to body and restricts use  HENT:     Head: Normocephalic and atraumatic.  Eyes:  Conjunctiva/sclera: Conjunctivae normal.     Pupils: Pupils are equal, round, and reactive to light.  Cardiovascular:     Rate and Rhythm: Normal rate.  Pulmonary:     Effort: Pulmonary effort is normal. No respiratory distress.  Abdominal:     General: There is no distension.     Palpations: Abdomen is soft.  Musculoskeletal:        General: Normal range of motion.     Cervical back: Normal range of motion.  Skin:    General: Skin is warm and dry.     Comments: There are multiple punctures on the hand as are photographed in the last visit.  The thenar evidence is bruised.  Slight warmth.  Faint erythema surrounding the puncture on the dorsum and swelling.  Also erythema on volar wrist  Neurological:     Mental Status: She is alert.      UC Treatments / Results  Labs (all labs ordered are listed, but only abnormal results are displayed) Labs Reviewed - No data to display  EKG   Radiology No results found.  Procedures Procedures  Half of the kedrab was infiltrated around the left hand punctures and the rest in things, Rabavert given Tdap up to date  Medications Ordered in UC Medications  rabies immune globulin (HYPERRAB/KEDRAB) injection 2,100 Units (has no administration in time range)  rabies vaccine (RABAVERT) injection 1 mL (has no administration in time range)  ketorolac (TORADOL) 30 MG/ML injection 30 mg (30 mg Intramuscular Given 02/19/23 1231)    Initial Impression / Assessment and Plan / UC Course  I have reviewed the triage vital signs and the nursing notes.  Pertinent labs & imaging results that were available during my care of the patient were reviewed by me and considered in my medical decision making (see chart for details).     I explained to the patient that the incidence of rabies from domestic dog bites is close to 0.  We actually  have 10 days to evaluate the dog and determine if it is rapid.  Animal control is working on the situation and may yet provide [place] with more information.  I told the patient that if it were me I would not get the rabies vaccination or RabAvert.  Patient is concerned and wishes to go ahead with rabies prophylaxis.  This is based on her primary care doctors evaluation, and there are good pre-existing relationship Final Clinical Impressions(s) / UC Diagnoses   Final diagnoses:  Dog bite, initial encounter  Need for rabies vaccination  Need for post exposure prophylaxis for rabies  Cellulitis of left upper extremity     Discharge Instructions      Continue taking Augmentin until complete.  Add doxycycline Elevate hand to reduce pain and swelling Take pain medication as needed  Rabies vaccines need to be given on days 3 5 and 7 after today's dose, day 1    ED Prescriptions     Medication Sig Dispense Auth. Provider   doxycycline (VIBRAMYCIN) 100 MG capsule Take 1 capsule (100 mg total) by mouth 2 (two) times daily. 20 capsule Eustace Moore, MD      PDMP not reviewed this encounter.   Eustace Moore, MD 02/19/23 1325    Eustace Moore, MD 02/19/23 [phone]

## 2023-02-19 NOTE — Discharge Instructions (Addendum)
Continue taking Augmentin until complete.  Add doxycycline Elevate hand to reduce pain and swelling Take pain medication as needed  Rabies vaccines need to be given on days 3 5 and 7 after today's dose, day 1

## 2023-02-22 ENCOUNTER — Other Ambulatory Visit: Payer: Self-pay

## 2023-02-22 ENCOUNTER — Ambulatory Visit
Admission: EM | Admit: 2023-02-22 | Discharge: 2023-02-22 | Disposition: A | Discharge status: Home / Self Care | Payer: Commercial Managed Care - HMO | Attending: Family Medicine | Admitting: Family Medicine

## 2023-02-22 DIAGNOSIS — Z23 Encounter for immunization: Secondary | ICD-10-CM | POA: Diagnosis not present

## 2023-02-22 DIAGNOSIS — Z203 Contact with and (suspected) exposure to rabies: Secondary | ICD-10-CM | POA: Diagnosis not present

## 2023-02-22 MED ORDER — RABIES VACCINE, PCEC IM SUSR
1.0000 mL | Freq: Once | INTRAMUSCULAR | Status: AC
  Administered 2023-02-22: 1 mL via INTRAMUSCULAR

## 2023-02-22 NOTE — ED Triage Notes (Signed)
Pt here today for rabies shot day 3

## 2023-02-25 ENCOUNTER — Telehealth: Payer: Self-pay | Admitting: Internal Medicine

## 2023-02-25 ENCOUNTER — Other Ambulatory Visit: Payer: Self-pay | Admitting: Internal Medicine

## 2023-02-25 DIAGNOSIS — W540XXD Bitten by dog, subsequent encounter: Secondary | ICD-10-CM

## 2023-02-25 MED ORDER — OXYCODONE-ACETAMINOPHEN 5-325 MG PO TABS
1.0000 | ORAL_TABLET | Freq: Four times a day (QID) | ORAL | 0 refills | Status: AC | PRN
Start: 2023-02-25 — End: 2023-03-02

## 2023-02-25 NOTE — Telephone Encounter (Signed)
I have sent in prescription for medication to [facility]

## 2023-02-25 NOTE — Telephone Encounter (Signed)
Please advise 

## 2023-02-25 NOTE — Telephone Encounter (Signed)
Patient dropped off document  vaccination , to be filled out by provider. Patient requested to send it back via Call Patient to pick up within 5-days. Document is located in providers tray at front office.Please advise at Mobile [phone] (mobile)  Pt dropped off paperwork . I put in the dr box

## 2023-02-25 NOTE — Telephone Encounter (Signed)
Pt is requesting a pain med for her hand due to the bug bite. She said the first has expired.

## 2023-02-26 ENCOUNTER — Telehealth: Payer: Self-pay | Admitting: Internal Medicine

## 2023-02-26 ENCOUNTER — Encounter: Payer: Self-pay | Admitting: Emergency Medicine

## 2023-02-26 ENCOUNTER — Ambulatory Visit
Admission: EM | Admit: 2023-02-26 | Discharge: 2023-02-26 | Disposition: A | Discharge status: Home / Self Care | Payer: Commercial Managed Care - HMO | Attending: Family Medicine | Admitting: Family Medicine

## 2023-02-26 DIAGNOSIS — Z23 Encounter for immunization: Secondary | ICD-10-CM | POA: Diagnosis not present

## 2023-02-26 DIAGNOSIS — Z0279 Encounter for issue of other medical certificate: Secondary | ICD-10-CM

## 2023-02-26 MED ORDER — RABIES VACCINE, PCEC IM SUSR
1.0000 mL | Freq: Once | INTRAMUSCULAR | Status: AC
  Administered 2023-02-26: 1 mL via INTRAMUSCULAR

## 2023-02-26 NOTE — Telephone Encounter (Signed)
Pt want you to call her . Pt said it is about the paperwork she dropped off . Pt said she need filled out a.s.a.p.

## 2023-02-26 NOTE — ED Triage Notes (Signed)
Here for rabies vaccine - day 7.

## 2023-02-26 NOTE — Telephone Encounter (Signed)
Returned call to patient and was given fax number to send forms to.  Dr. Janee Morn does need to sign but is out of the office,  patient was informed of this

## 2023-02-26 NOTE — Telephone Encounter (Signed)
Form placed in PCP folder.

## 2023-03-05 ENCOUNTER — Ambulatory Visit
Admission: EM | Admit: 2023-03-05 | Discharge: 2023-03-05 | Disposition: A | Discharge status: Home / Self Care | Payer: Commercial Managed Care - HMO | Attending: Internal Medicine | Admitting: Internal Medicine

## 2023-03-05 DIAGNOSIS — Z23 Encounter for immunization: Secondary | ICD-10-CM

## 2023-03-05 MED ORDER — RABIES VACCINE, PCEC IM SUSR
1.0000 mL | Freq: Once | INTRAMUSCULAR | Status: AC
  Administered 2023-03-05: 1 mL via INTRAMUSCULAR

## 2023-03-05 NOTE — ED Triage Notes (Signed)
Here for last rabies vaccination.

## 2023-03-12 ENCOUNTER — Ambulatory Visit (INDEPENDENT_AMBULATORY_CARE_PROVIDER_SITE_OTHER): Payer: Commercial Managed Care - HMO

## 2023-03-12 DIAGNOSIS — Z23 Encounter for immunization: Secondary | ICD-10-CM

## 2023-03-12 NOTE — Progress Notes (Signed)
Pt in office today toe receive MMR, Tdap and Hep be vaccinations today ordered by Salvatore Decent, FNP.  Pt tolerated injections well.  Right Deltoid MMR and Hep B Left Deltoid Tdap.

## 2023-03-17 NOTE — Progress Notes (Deleted)
Subjective:   Melissa Dodson 1970-07-27  03/19/2023   CC: No chief complaint on file.   HPI: Melissa Dodson is a 52 y.o. female who presents for a routine health maintenance exam.  Labs collected at time of visit.    HEALTH SCREENINGS: - Pap smear: {Blank single:19197::"pap done","not applicable","up to date","done elsewhere"} - Mammogram (40+): {Blank single:19197::"Up to date","Ordered today","Not applicable","Refused","Done elsewhere"}  - Colonoscopy (45+): {Blank single:19197::"Up to date","Ordered today","Not applicable","Refused","Done elsewhere"}  - Bone Density (65+): Not applicable  - Lung CA screening with low-dose CT:  Not applicable Adults age 61-80 who are current cigarette smokers or quit within the last 15 years. Must have 20 pack year history.   Depression and Anxiety Screen done today and results listed below:     12/25/2022    9:31 AM 09/01/2014   11:16 AM  Depression screen PHQ 2/9  Decreased Interest 0 0  Down, Depressed, Hopeless 0 0  PHQ - 2 Score 0 0  Altered sleeping 0   Tired, decreased energy 0   Change in appetite 0   Feeling bad or failure about yourself  0   Trouble concentrating 0   Moving slowly or fidgety/restless 0   Suicidal thoughts 0   PHQ-9 Score 0   Difficult doing work/chores Not difficult at all       12/25/2022    9:31 AM  GAD 7 : Generalized Anxiety Score  Nervous, Anxious, on Edge 0  Control/stop worrying 0  Worry too much - different things 0  Trouble relaxing 0  Restless 0  Easily annoyed or irritable 0  Afraid - awful might happen 0  Total GAD 7 Score 0  Anxiety Difficulty Not difficult at all    IMMUNIZATIONS: - Tdap: Tetanus vaccination status reviewed: last tetanus booster within 10 years. - Influenza: {Blank single:19197::"Up to date","Administered today","Postponed to flu season","Refused","Given elsewhere"} - Pneumovax: Not applicable - Prevnar 20: Not applicable - Zostavax (50+): {Blank  single:19197::"Up to date","Administered today","Not applicable","Refused","Given elsewhere"}   Past medical history, surgical history, medications, allergies, family history and social history reviewed with patient today and changes made to appropriate areas of the chart.   Past Medical History:  Diagnosis Date   Asthma with exacerbation 09/01/2014   Depression    Headache    History of laparoscopic adjustable gastric banding-done in [place] 01/26/2014   No info on type of band   Hypoxia    S/P gastric surgery 09/01/2014   Status post laparoscopic cholecystectomy August 2015 01/28/2014    Past Surgical History:  Procedure Laterality Date   ABDOMINAL HYSTERECTOMY     CHOLECYSTECTOMY N/A 01/28/2014   Procedure: LAPAROSCOPIC CHOLECYSTECTOMY WITH INTRAOPERATIVE CHOLANGIOGRAM;  Surgeon: Valarie Merino, MD;  Location: [facility];  Service: General;  Laterality: N/A;   KNEE SURGERY     LAPAROSCOPIC GASTRIC BANDING N/A 2013   performed in [place]   TONSILLECTOMY      Current Outpatient Medications on File Prior to Visit  Medication Sig   amoxicillin-clavulanate (AUGMENTIN) 875-125 MG tablet Take 1 tablet by mouth every 12 (twelve) hours.   amphetamine-dextroamphetamine (ADDERALL) 20 MG tablet Take 1 tablet (20 mg total) by mouth 2 (two) times daily.   [START ON 03/25/2023] amphetamine-dextroamphetamine (ADDERALL) 20 MG tablet Take 1 tablet (20 mg total) by mouth 2 (two) times daily.   amphetamine-dextroamphetamine (ADDERALL) 20 MG tablet Take 1 tablet (20 mg total) by mouth 2 (two) times daily.   BIOTIN PO Take by mouth daily.   doxycycline (VIBRAMYCIN) 100 MG  capsule Take 1 capsule (100 mg total) by mouth 2 (two) times daily.   EPINEPHrine 0.3 mg/0.3 mL IJ SOAJ injection Inject 0.3 mLs (0.3 mg total) into the muscle once.   Semaglutide-Weight Management 0.25 MG/0.5ML SOAJ Inject 0.25mg  into skin once weekly for 4 weeks. Then increase dose to 0.5mg  once weekly.   No current  facility-administered medications on file prior to visit.    Allergies  Allergen Reactions   Nystatin Anaphylaxis and Swelling    Patient broke out in rash on extremities, had swelling of the nasal passage, throat and lips. Reaction was increasing over the course of 1-3 days.   Mango Flavor Swelling   Sulfa Antibiotics Hives     Social History   Socioeconomic History   Marital status: Married    Spouse name: Not on file   Number of children: Not on file   Years of education: Not on file   Highest education level: Not on file  Occupational History   Not on file  Tobacco Use   Smoking status: Never   Smokeless tobacco: Never  Vaping Use   Vaping status: Never Used  Substance and Sexual Activity   Alcohol use: Yes    Comment: social on occ   Drug use: No   Sexual activity: Yes    Birth control/protection: Surgical  Other Topics Concern   Not on file  Social History Narrative   Not on file   Social Determinants of Health   Financial Resource Strain: Low Risk  (02/22/2021)   [facility] from [facility] visits prior to 08/17/2022., [facility] visits prior to 08/17/2022.   Overall Financial Resource Strain (CARDIA)    Difficulty of Paying Living Expenses: Not hard at all  Food Insecurity: No Food Insecurity (02/22/2021)   Received from [facility] visits prior to 08/17/2022., [facility] visits prior to 08/17/2022.   Hunger Vital Sign    Worried About Running Out of Food in the Last Year: Never true    Ran Out of Food in the Last Year: Never true  Transportation Needs: No Transportation Needs (02/22/2021)   Received from [facility] visits prior to 08/17/2022., [facility] visits prior to 08/17/2022.   PRAPARE - Administrator, Civil Service (Medical): No    Lack of Transportation (Non-Medical): No  Physical Activity: Sufficiently Active (02/22/2021)    Received from [facility] visits prior to 08/17/2022., [facility] visits prior to 08/17/2022.   Exercise Vital Sign    Days of Exercise per Week: 4 days    Minutes of Exercise per Session: 60 min  Stress: Not on file  Social Connections: Not on file  Intimate Partner Violence: Not on file   Social History   Tobacco Use  Smoking Status Never  Smokeless Tobacco Never   Social History   Substance and Sexual Activity  Alcohol Use Yes   Comment: social on occ    Family History  Adopted: Yes  Family history unknown: Yes     ROS: Denies fever, fatigue, unexplained weight loss/gain, hearing or vision changes, cardiac or respiratory complaints. Denies neurological deficits, musculoskeletal complaints, gastrointestinal or genitourinary complaints, mental health complaints, and skin changes.   Objective:   There were no vitals filed for this visit.  GENERAL APPEARANCE: Well-appearing, in NAD. Well nourished.  SKIN: Pink, warm and dry. Turgor normal. No rash,  lesion, ulceration, or ecchymoses. Hair evenly distributed.  HEENT: HEAD: Normocephalic.  EYES: PERRLA. EOMI. Lids intact w/o defect. Sclera white, Conjunctiva pink w/o exudate.  EARS: External ear w/o redness, swelling, masses or lesions. EAC clear. TM's intact, translucent w/o bulging, appropriate landmarks visualized. Appropriate acuity to conversational tones.  NOSE: Septum midline w/o deformity. Nares patent, mucosa pink and non-inflamed w/o drainage. No sinus tenderness.  THROAT: Uvula midline. Oropharynx clear. Tonsils non-inflamed w/o exudate ***. Oral mucosa pink and moist.  NECK: Supple, Trachea midline. Full ROM w/o pain or tenderness. No lymphadenopathy. Thyroid non-tender w/o enlargement or palpable masses.  BREASTS: Breasts pendulous, symmetrical, and w/o palpable masses. Nipples everted and w/o discharge. No rash or skin retraction. No axillary or supraclavicular  lymphadenopathy.  RESPIRATORY: Chest wall symmetrical w/o masses. Respirations even and non-labored. Breath sounds clear to auscultation bilaterally. No wheezes, rales, rhonchi, or crackles. CARDIAC: S1, S2 present, regular rate and rhythm. No gallops, murmurs, rubs, or clicks. No carotid bruits. Capillary refill <2 seconds. Peripheral pulses 2+ bilaterally. GI: Abdomen soft w/o distention. Normoactive bowel sounds. No palpable masses or tenderness. No guarding or rebound tenderness. Liver and spleen w/o tenderness or enlargement. No CVA tenderness.  GU: *** External genitalia without erythema, lesions, or masses. No lymphadenopathy. Vaginal mucosa pink and moist without exudate, lesions, or ulcerations. Cervix pink without discharge. Cervical os closed. Uterus and adnexae palpable, not enlarged, and w/o tenderness. No palpable masses.  MSK: Muscle tone and strength appropriate for age, w/o atrophy or abnormal movement.  EXTREMITIES: Active ROM intact, w/o tenderness, crepitus, or contracture. No obvious joint deformities or effusions. No clubbing, edema, or cyanosis.  NEUROLOGIC: CN's II-XII intact. Motor strength symmetrical with no obvious weakness. No sensory deficits. DTR's 2+ symmetric bilaterally. Steady, even gait.  PSYCH/MENTAL STATUS: Alert, oriented x 3. Cooperative, appropriate mood and affect.   Chaperoned by Mary Sella CMA ***   Assessment & Plan:  There are no diagnoses linked to this encounter.  No orders of the defined types were placed in this encounter.   PATIENT COUNSELING:  - Encouraged a healthy well-balanced diet. Patient may adjust caloric intake to maintain or achieve ideal body weight. May reduce intake of dietary saturated fat and total fat and have adequate dietary potassium and calcium preferably from fresh fruits, vegetables, and low-fat dairy products.   - Advised to avoid cigarette smoking. - Discussed with the patient that most people either abstain from  alcohol or drink within safe limits (<=14/week and <=4 drinks/occasion for males, <=7/weeks and <= 3 drinks/occasion for females) and that the risk for alcohol disorders and other health effects rises proportionally with the number of drinks per week and how often a drinker exceeds daily limits. - Discussed cessation/primary prevention of drug use and availability of treatment for abuse.  - Discussed sexually transmitted diseases, avoidance of unintended pregnancy and contraceptive alternatives.  - Stressed the importance of regular exercise - Injury prevention: Discussed safety belts, safety helmets, smoke detector, smoking near bedding or upholstery.  - Dental health: Discussed importance of regular tooth brushing, flossing, and dental visits.   NEXT PREVENTATIVE PHYSICAL DUE IN 1 YEAR.  No follow-ups on file.  Salvatore Decent, FNP

## 2023-03-19 ENCOUNTER — Encounter: Payer: Self-pay | Admitting: Nurse Practitioner

## 2023-03-19 ENCOUNTER — Encounter: Payer: Self-pay | Admitting: Internal Medicine

## 2023-03-19 ENCOUNTER — Ambulatory Visit: Payer: Managed Care, Other (non HMO) | Admitting: Nurse Practitioner

## 2023-03-19 ENCOUNTER — Telehealth: Payer: Self-pay | Admitting: Internal Medicine

## 2023-03-19 ENCOUNTER — Ambulatory Visit (INDEPENDENT_AMBULATORY_CARE_PROVIDER_SITE_OTHER)
Admission: RE | Admit: 2023-03-19 | Discharge: 2023-03-19 | Disposition: A | Discharge status: Home / Self Care | Payer: Commercial Managed Care - HMO | Source: Ambulatory Visit | Attending: Nurse Practitioner

## 2023-03-19 VITALS — BP 126/80 | HR 80 | Resp 18 | Ht 70.0 in

## 2023-03-19 DIAGNOSIS — M19049 Primary osteoarthritis, unspecified hand: Secondary | ICD-10-CM

## 2023-03-19 DIAGNOSIS — S6992XD Unspecified injury of left wrist, hand and finger(s), subsequent encounter: Secondary | ICD-10-CM

## 2023-03-19 DIAGNOSIS — Z23 Encounter for immunization: Secondary | ICD-10-CM

## 2023-03-19 DIAGNOSIS — W540XXD Bitten by dog, subsequent encounter: Secondary | ICD-10-CM

## 2023-03-19 DIAGNOSIS — Z0000 Encounter for general adult medical examination without abnormal findings: Secondary | ICD-10-CM

## 2023-03-19 DIAGNOSIS — S6992XS Unspecified injury of left wrist, hand and finger(s), sequela: Secondary | ICD-10-CM

## 2023-03-19 DIAGNOSIS — Z124 Encounter for screening for malignant neoplasm of cervix: Secondary | ICD-10-CM

## 2023-03-19 DIAGNOSIS — Z1231 Encounter for screening mammogram for malignant neoplasm of breast: Secondary | ICD-10-CM

## 2023-03-19 NOTE — Patient Instructions (Signed)
Go to [address] for hand and wrist x-ray.

## 2023-03-19 NOTE — Telephone Encounter (Signed)
Pt called for results. Read her providers msg that was sent via mychart. Pt understood and had no further questions.

## 2023-03-19 NOTE — Progress Notes (Signed)
Acute Office Visit  Subjective:    Patient ID: Melissa Dodson, female    DOB: Nov 16, 1970, 52 y.o.   MRN: [id number]  Chief Complaint  Patient presents with   Animal Bite    L hand  Pt states she came in office 02/19/2023 when bite first happened, however pt states her hand is "hurting more than it should"    Hand Pain  The incident occurred more than 1 week ago. The incident occurred at home. Injury mechanism: dog bite by a chihuahua on 02/19/2023. The pain is present in the left hand. The quality of the pain is described as aching. The pain does not radiate. The pain is severe. The pain has been Constant since the incident. Associated symptoms include muscle weakness. Pertinent negatives include no chest pain, numbness or tingling. The symptoms are aggravated by movement, palpation and lifting. She has tried ice and acetaminophen for the symptoms. The treatment provided mild relief.  Has completed doxycycline x 7days and augmentin x 10days. Has also completed rabies vaccine series UTD with TDAP 02/2023.  Outpatient Medications Prior to Visit  Medication Sig   amphetamine-dextroamphetamine (ADDERALL) 20 MG tablet Take 1 tablet (20 mg total) by mouth 2 (two) times daily.   [START ON 03/25/2023] amphetamine-dextroamphetamine (ADDERALL) 20 MG tablet Take 1 tablet (20 mg total) by mouth 2 (two) times daily.   amphetamine-dextroamphetamine (ADDERALL) 20 MG tablet Take 1 tablet (20 mg total) by mouth 2 (two) times daily.   BIOTIN PO Take by mouth daily.   EPINEPHrine 0.3 mg/0.3 mL IJ SOAJ injection Inject 0.3 mLs (0.3 mg total) into the muscle once.   [DISCONTINUED] amoxicillin-clavulanate (AUGMENTIN) 875-125 MG tablet Take 1 tablet by mouth every 12 (twelve) hours.   [DISCONTINUED] doxycycline (VIBRAMYCIN) 100 MG capsule Take 1 capsule (100 mg total) by mouth 2 (two) times daily.   [DISCONTINUED] Semaglutide-Weight Management 0.25 MG/0.5ML SOAJ Inject 0.25mg  into skin once weekly for 4  weeks. Then increase dose to 0.5mg  once weekly.   No facility-administered medications prior to visit.    Reviewed past medical and social history.  Review of Systems  Cardiovascular:  Negative for chest pain.  Neurological:  Negative for tingling and numbness.   Per HPI     Objective:    Physical Exam Vitals and nursing note reviewed.  Musculoskeletal:     Left wrist: Tenderness, bony tenderness and snuff box tenderness present. No swelling, deformity, effusion, lacerations or crepitus. Normal range of motion. Normal pulse.     Left hand: Tenderness and bony tenderness present. No swelling, deformity or lacerations. Decreased range of motion. Decreased strength of finger abduction, thumb/finger opposition and wrist extension. Normal sensation. Normal capillary refill.     Comments: Healed puncture wounds  Neurological:     Mental Status: She is alert.    BP 126/80 (BP Location: Right Arm, Patient Position: Sitting, Cuff Size: Normal)   Pulse 80   Resp 18   Ht 5\' 10"  (1.778 m)   SpO2 97%   BMI 35.84 kg/m    No results found for any visits on 03/19/23.     Assessment & Plan:   Problem List Items Addressed This Visit     Arthritis of carpometacarpal joint   Relevant Orders   Ambulatory referral to Hand Surgery   Other Visit Diagnoses     Dog bite, subsequent encounter    -  Primary   Relevant Orders   DG Hand Complete Left (Completed)   DG Wrist Complete Left (Completed)  Ambulatory referral to Hand Surgery   Hand injury, left, sequela       Relevant Orders   DG Hand Complete Left (Completed)   DG Wrist Complete Left (Completed)   Ambulatory referral to Hand Surgery     No fracture and no foreign object. 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 entered referral to hand specialist.  Use motrin 600mg  every 8hrs or naproxen 500mg  every 12hrs as needed for pain. Take med with food.  No orders of the defined types were placed in this encounter.  Return if  symptoms worsen or fail to improve.    Alysia Penna, NP

## 2023-03-21 ENCOUNTER — Other Ambulatory Visit: Payer: Self-pay | Admitting: Family

## 2023-03-21 ENCOUNTER — Encounter: Payer: Commercial Managed Care - HMO | Admitting: Internal Medicine

## 2023-03-21 ENCOUNTER — Telehealth: Payer: Self-pay | Admitting: Internal Medicine

## 2023-03-21 DIAGNOSIS — F988 Other specified behavioral and emotional disorders with onset usually occurring in childhood and adolescence: Secondary | ICD-10-CM

## 2023-03-21 MED ORDER — AMPHETAMINE-DEXTROAMPHETAMINE 20 MG PO TABS
20.0000 mg | ORAL_TABLET | Freq: Two times a day (BID) | ORAL | 0 refills | Status: DC
Start: 2023-03-21 — End: 2023-04-28

## 2023-03-21 NOTE — Telephone Encounter (Signed)
Prescription Request  03/21/2023  LOV: 02/19/2023  What is the name of the medication or equipment? amphetamine-dextroamphetamine (ADDERALL) 20 MG tablet [[id number]]   Have you contacted your pharmacy to request a refill? No   Which pharmacy would you like this sent to?  [facility] [address] - [place], [place] - [address], [address] [place] [address] Phone: [phone] Fax: [phone]   Patient notified that their request is being sent to the clinical staff for review and that they should receive a response within 2 business days.   Please advise at Mobile [phone] (mobile)

## 2023-03-25 ENCOUNTER — Ambulatory Visit (INDEPENDENT_AMBULATORY_CARE_PROVIDER_SITE_OTHER): Payer: Managed Care, Other (non HMO) | Admitting: Nurse Practitioner

## 2023-03-25 ENCOUNTER — Encounter: Payer: Self-pay | Admitting: Nurse Practitioner

## 2023-03-25 VITALS — BP 118/82 | HR 87 | Temp 97.0°F | Ht 70.0 in | Wt 244.6 lb

## 2023-03-25 DIAGNOSIS — M79642 Pain in left hand: Secondary | ICD-10-CM | POA: Insufficient documentation

## 2023-03-25 DIAGNOSIS — W540XXD Bitten by dog, subsequent encounter: Secondary | ICD-10-CM | POA: Diagnosis not present

## 2023-03-25 MED ORDER — MELOXICAM 15 MG PO TABS: 15.0000 mg | ORAL_TABLET | Freq: Every day | ORAL | 0 refills | Status: DC

## 2023-03-25 NOTE — Patient Instructions (Signed)
It was great to see you!  I have ordered a MRI of your hand, they will call to schedule.  I have also placed a referral to orthopedics  Start meloxicam once a day with food. Do not take ibuprofen or aleve with this.   Let's follow-up if symptoms worsen or don't improve.   Take care,  Rodman Pickle, NP

## 2023-03-25 NOTE — Progress Notes (Signed)
Acute Office Visit  Subjective:     Patient ID: Melissa Dodson, female    DOB: [birth year], 52 y.o.   MRN: [id number]  Chief Complaint  Patient presents with   Animal Bite    Follow up Dog bite to left hand on 02-17-23, movement makes it hurt more    HPI Patient is in today for left hand pain and swelling for the last month after a dog bite on 02/17/2023.  She states that she was bit by a dog last month.  She received rabies vaccines, Tdap, several rounds of antibiotics.  She had an x-ray which was negative for fracture.  She is still having significant pain, swelling, limited range of motion to her left palm.  The pain does radiate however around near her wrist.  She has been using Aleve, Tylenol, heat, ice.  She currently rates the pain a 3/10.  She states that the heat made the pain worse.  She is concerned that she is still having swelling and pain.  ROS See pertinent positives and negatives per HPI.     Objective:    BP 118/82 (BP Location: Right Arm, Cuff Size: Large)   Pulse 87   Temp (!) 97 F (36.1 C)   Ht 5\' 10"  (1.778 m)   Wt 244 lb 9.6 oz (110.9 kg)   SpO2 97%   BMI 35.10 kg/m    Physical Exam Vitals and nursing note reviewed.  Constitutional:      General: She is not in acute distress.    Appearance: Normal appearance.  HENT:     Head: Normocephalic.  Eyes:     Conjunctiva/sclera: Conjunctivae normal.  Pulmonary:     Effort: Pulmonary effort is normal.  Musculoskeletal:        General: Swelling (left palm) and tenderness (left palm) present.     Cervical back: Normal range of motion.     Comments: Limited ROM to her thumb  Skin:    General: Skin is warm.  Neurological:     General: No focal deficit present.     Mental Status: She is alert and oriented to person, place, and time.  Psychiatric:        Mood and Affect: Mood normal.        Behavior: Behavior normal.        Thought Content: Thought content normal.        Judgment: Judgment normal.        Assessment & Plan:   Problem List Items Addressed This Visit       Other   Left hand pain - Primary    She is still having pain in her left hand along with limited range of motion and swelling after a dog bite on 02/17/2023.  She received several rounds of antibiotics and Tdap and rabies vaccines.  The bite mark areas are still slightly red, however healing.  There is no drainage.  She does have significant tenderness with palpation and limited range of motion of her left thumb.  X-ray was negative.  Will get an MRI of her left hand and place referral to orthopedics.  Start meloxicam 15 mg daily with food as needed for pain.  Do not take Aleve or ibuprofen while taking the meloxicam      Relevant Orders   MR HAND LEFT WO CONTRAST   Ambulatory referral to Orthopedic Surgery   Other Visit Diagnoses     Dog bite, subsequent encounter  Still having ongoing pain. See plan above. Has received Tdap and rabies vaccines.   Relevant Orders   MR HAND LEFT WO CONTRAST   Ambulatory referral to Orthopedic Surgery       Meds ordered this encounter  Medications   meloxicam (MOBIC) 15 MG tablet    Sig: Take 1 tablet (15 mg total) by mouth daily.    Dispense:  30 tablet    Refill:  0    Return if symptoms worsen or fail to improve.  Gerre Scull, NP

## 2023-03-25 NOTE — Assessment & Plan Note (Addendum)
She is still having pain in her left hand along with limited range of motion and swelling after a dog bite on 02/17/2023.  She received several rounds of antibiotics and Tdap and rabies vaccines.  The bite mark areas are still slightly red, however healing.  There is no drainage.  She does have significant tenderness with palpation and limited range of motion of her left thumb.  X-ray was negative.  Will get an MRI of her left hand and place referral to orthopedics.  Start meloxicam 15 mg daily with food as needed for pain.  Do not take Aleve or ibuprofen while taking the meloxicam

## 2023-03-25 NOTE — Addendum Note (Signed)
Addended by: Rodman Pickle A on: 03/25/2023 03:50 PM   Modules accepted: Orders

## 2023-04-01 ENCOUNTER — Telehealth: Payer: Self-pay | Admitting: Internal Medicine

## 2023-04-01 NOTE — Telephone Encounter (Signed)
Auth and updated . Order sent to imaging for scheduling

## 2023-04-01 NOTE — Telephone Encounter (Signed)
Pt has been approved for her MRI for MR HAND LEFT WO CONTRAST (Order [id number]) that Lauren ordered on 03/25/23.

## 2023-04-11 ENCOUNTER — Telehealth: Payer: Self-pay | Admitting: Internal Medicine

## 2023-04-11 ENCOUNTER — Encounter: Payer: Self-pay | Admitting: Internal Medicine

## 2023-04-11 NOTE — Telephone Encounter (Signed)
04/11/23 - Pt called wanting the CMA to fax the last 3 immunizations she had at her visit to the [employer]. Details below:  Fax to: Eliseo Squires Fax #: [phone]

## 2023-04-14 ENCOUNTER — Encounter: Payer: Self-pay | Admitting: Internal Medicine

## 2023-04-16 NOTE — Telephone Encounter (Signed)
Immunizations faxed.

## 2023-04-25 ENCOUNTER — Telehealth: Payer: Self-pay | Admitting: Internal Medicine

## 2023-04-25 NOTE — Telephone Encounter (Signed)
Last Ov 02/19/23 Filled 03/21/23 Upcoming appt 05/09/23

## 2023-04-25 NOTE — Telephone Encounter (Signed)
Prescription Request  04/25/2023  LOV: 02/19/2023  What is the name of the medication or equipment? amphetamine-dextroamphetamine (ADDERALL) 20 MG tablet [[id number]]    Have you contacted your pharmacy to request a refill? Yes, she would also like a 3mon supply  Which pharmacy would you like this sent to?  [facility] [address] - [place], [place] - [address] [place] [address] Phone: [phone] Fax: [phone]      Patient notified that their request is being sent to the clinical staff for review and that they should receive a response within 2 business days.   Please advise at [facility] [phone]

## 2023-04-28 ENCOUNTER — Other Ambulatory Visit: Payer: Self-pay | Admitting: Internal Medicine

## 2023-04-28 DIAGNOSIS — F988 Other specified behavioral and emotional disorders with onset usually occurring in childhood and adolescence: Secondary | ICD-10-CM

## 2023-04-28 MED ORDER — AMPHETAMINE-DEXTROAMPHETAMINE 20 MG PO TABS: 20.0000 mg | ORAL_TABLET | Freq: Two times a day (BID) | ORAL | 0 refills | Status: DC

## 2023-04-28 MED ORDER — AMPHETAMINE-DEXTROAMPHETAMINE 20 MG PO TABS
20.0000 mg | ORAL_TABLET | Freq: Two times a day (BID) | ORAL | 0 refills | Status: DC
Start: 2023-06-28 — End: 2023-08-25

## 2023-04-28 NOTE — Telephone Encounter (Signed)
(  3) 30 day prescriptions sent in to pharmacy for patient. = 90 day supply. Due to controlled substance I can only send in 30 day supply, but the refills will be at pharmacy.

## 2023-05-09 ENCOUNTER — Ambulatory Visit (INDEPENDENT_AMBULATORY_CARE_PROVIDER_SITE_OTHER): Payer: Commercial Managed Care - HMO | Admitting: Internal Medicine

## 2023-05-09 ENCOUNTER — Encounter: Payer: Self-pay | Admitting: Internal Medicine

## 2023-05-09 VITALS — BP 116/72 | HR 75 | Temp 97.7°F | Ht 70.0 in | Wt 237.6 lb

## 2023-05-09 DIAGNOSIS — N951 Menopausal and female climacteric states: Secondary | ICD-10-CM | POA: Diagnosis not present

## 2023-05-09 DIAGNOSIS — E538 Deficiency of other specified B group vitamins: Secondary | ICD-10-CM | POA: Diagnosis not present

## 2023-05-09 DIAGNOSIS — Z0000 Encounter for general adult medical examination without abnormal findings: Secondary | ICD-10-CM

## 2023-05-09 DIAGNOSIS — Z1231 Encounter for screening mammogram for malignant neoplasm of breast: Secondary | ICD-10-CM

## 2023-05-09 LAB — COMPREHENSIVE METABOLIC PANEL
ALT: 11 U/L (ref 0–35)
AST: 12 U/L (ref 0–37)
Albumin: 4.3 g/dL (ref 3.5–5.2)
Alkaline Phosphatase: 61 U/L (ref 39–117)
BUN: 17 mg/dL (ref 6–23)
CO2: 26 meq/L (ref 19–32)
Calcium: 9.3 mg/dL (ref 8.4–10.5)
Chloride: 105 meq/L (ref 96–112)
Creatinine, Ser: 0.64 mg/dL (ref 0.40–1.20)
GFR: 101.6 mL/min (ref >60.00)
Glucose, Bld: 102 mg/dL — ABNORMAL HIGH (ref 70–99)
Potassium: 4 meq/L (ref 3.5–5.1)
Sodium: 139 meq/L (ref 135–145)
Total Bilirubin: 0.5 mg/dL (ref 0.2–1.2)
Total Protein: 6.8 g/dL (ref 6.0–8.3)

## 2023-05-09 LAB — CBC WITH DIFFERENTIAL/PLATELET
Basophils Absolute: 0 10*3/uL (ref 0.0–0.1)
Basophils Relative: 0.6 % (ref 0.0–3.0)
Eosinophils Absolute: 0.2 10*3/uL (ref 0.0–0.7)
Eosinophils Relative: 3 % (ref 0.0–5.0)
HCT: 44.2 % (ref 36.0–46.0)
Hemoglobin: 14.6 g/dL (ref 12.0–15.0)
Lymphocytes Relative: 39.1 % (ref 12.0–46.0)
Lymphs Abs: 2.3 10*3/uL (ref 0.7–4.0)
MCHC: 33 g/dL (ref 30.0–36.0)
MCV: 89.5 fL (ref 78.0–100.0)
Monocytes Absolute: 0.3 10*3/uL (ref 0.1–1.0)
Monocytes Relative: 5.6 % (ref 3.0–12.0)
Neutro Abs: 3.1 10*3/uL (ref 1.4–7.7)
Neutrophils Relative %: 51.7 % (ref 43.0–77.0)
Platelets: 377 10*3/uL (ref 150.0–400.0)
RBC: 4.94 Mil/uL (ref 3.87–5.11)
RDW: 14 % (ref 11.5–15.5)
WBC: 5.9 10*3/uL (ref 4.0–10.5)

## 2023-05-09 LAB — LIPID PANEL
Cholesterol: 236 mg/dL — ABNORMAL HIGH (ref 0–200)
HDL: 51.9 mg/dL (ref >39.00)
LDL Cholesterol: 167 mg/dL — ABNORMAL HIGH (ref 0–99)
NonHDL: 183.93
Total CHOL/HDL Ratio: 5
Triglycerides: 84 mg/dL (ref 0.0–149.0)
VLDL: 16.8 mg/dL (ref 0.0–40.0)

## 2023-05-09 LAB — FOLLICLE STIMULATING HORMONE: FSH: 40 m[IU]/mL

## 2023-05-09 LAB — TSH: TSH: 0.48 u[IU]/mL (ref 0.35–5.50)

## 2023-05-09 LAB — VITAMIN B12: Vitamin B-12: 183 pg/mL — ABNORMAL LOW (ref 211–911)

## 2023-05-09 NOTE — Progress Notes (Signed)
Subjective:   Melissa Dodson Dec 20, 1970  05/09/2023   CC: Chief Complaint  Patient presents with   Annual Exam   Menopause    HPI: Melissa Dodson is a 52 y.o. female who presents for a routine health maintenance exam.  Labs collected at time of visit.   MENOPAUSAL SYMPTOMS Onset: couple years ago Hot flashes: yes Night sweats: yes - occasionally  Sleep disturbances: yes Vaginal dryness: yes Dyspareunia:no Decreased libido: yes Irregular Menstrual cycles:  hysterectomy  with 1 ovary intact.   Previous HRT/pharmacotherapy: no  Vitamin B12 Concern: Concern for b12 deficiency. Has heard of benefits of b12: energy, weight loss, etc. Would like b12 level evaluated.   HEALTH SCREENINGS: - Pap smear: not applicable - hysterectomy - Mammogram (40+): Ordered today  - Colonoscopy (45+): Up to date  - Bone Density (65+): Not applicable  - Lung CA screening with low-dose CT:  Not applicable Adults age 63-80 who are current cigarette smokers or quit within the last 15 years. Must have 20 pack year history.   Depression and Anxiety Screen done today and results listed below:     05/09/2023    8:34 AM 12/25/2022    9:31 AM 09/01/2014   11:16 AM  Depression screen PHQ 2/9  Decreased Interest 0 0 0  Down, Depressed, Hopeless 0 0 0  PHQ - 2 Score 0 0 0  Altered sleeping 0 0   Tired, decreased energy 0 0   Change in appetite 0 0   Feeling bad or failure about yourself  0 0   Trouble concentrating 0 0   Moving slowly or fidgety/restless 0 0   Suicidal thoughts 0 0   PHQ-9 Score 0 0   Difficult doing work/chores Not difficult at all Not difficult at all       05/09/2023    8:34 AM 12/25/2022    9:31 AM  GAD 7 : Generalized Anxiety Score  Nervous, Anxious, on Edge 0 0  Control/stop worrying 0 0  Worry too much - different things 0 0  Trouble relaxing 0 0  Restless 0 0  Easily annoyed or irritable 0 0  Afraid - awful might happen 0 0  Total GAD 7 Score 0 0  Anxiety  Difficulty Not difficult at all Not difficult at all    IMMUNIZATIONS: - Tdap: Tetanus vaccination status reviewed: last tetanus booster within 10 years. - Influenza: declined - Zostavax (50+):  info provided , deferred vaccination today   Past medical history, surgical history, medications, allergies, family history and social history reviewed with patient today and changes made to appropriate areas of the chart.   Past Medical History:  Diagnosis Date   Asthma with exacerbation 09/01/2014   Depression    Headache    History of laparoscopic adjustable gastric banding-done in [place] 01/26/2014   No info on type of band   Hypoxia    S/P gastric surgery 09/01/2014   Status post laparoscopic cholecystectomy August 2015 01/28/2014    Past Surgical History:  Procedure Laterality Date   ABDOMINAL HYSTERECTOMY     CHOLECYSTECTOMY N/A 01/28/2014   Procedure: LAPAROSCOPIC CHOLECYSTECTOMY WITH INTRAOPERATIVE CHOLANGIOGRAM;  Surgeon: Valarie Merino, MD;  Location: [facility];  Service: General;  Laterality: N/A;   KNEE SURGERY     LAPAROSCOPIC GASTRIC BANDING N/A 2013   performed in [place]   TONSILLECTOMY      Current Outpatient Medications on File Prior to Visit  Medication Sig   amphetamine-dextroamphetamine (ADDERALL) 20 MG tablet  Take 1 tablet (20 mg total) by mouth 2 (two) times daily.   [START ON 05/28/2023] amphetamine-dextroamphetamine (ADDERALL) 20 MG tablet Take 1 tablet (20 mg total) by mouth 2 (two) times daily.   [START ON 06/28/2023] amphetamine-dextroamphetamine (ADDERALL) 20 MG tablet Take 1 tablet (20 mg total) by mouth 2 (two) times daily.   BIOTIN PO Take by mouth daily.   EPINEPHrine 0.3 mg/0.3 mL IJ SOAJ injection Inject 0.3 mLs (0.3 mg total) into the muscle once.   methylPREDNISolone (MEDROL DOSEPAK) 4 MG TBPK tablet Take by mouth.   meloxicam (MOBIC) 15 MG tablet Take 1 tablet (15 mg total) by mouth daily. (Patient not taking: Reported on 05/09/2023)   No current  facility-administered medications on file prior to visit.    Allergies  Allergen Reactions   Nystatin Anaphylaxis and Swelling    Patient broke out in rash on extremities, had swelling of the nasal passage, throat and lips. Reaction was increasing over the course of 1-3 days.   Mango Flavor Swelling   Other Hives   Sulfa Antibiotics Hives   Poison Ivy Extract Rash     Social History   Socioeconomic History   Marital status: Married    Spouse name: Not on file   Number of children: Not on file   Years of education: Not on file   Highest education level: Not on file  Occupational History   Not on file  Tobacco Use   Smoking status: Never   Smokeless tobacco: Never  Vaping Use   Vaping status: Never Used  Substance and Sexual Activity   Alcohol use: Yes    Comment: social on occ   Drug use: No   Sexual activity: Yes    Birth control/protection: Surgical  Other Topics Concern   Not on file  Social History Narrative   Not on file   Social Determinants of Health   Financial Resource Strain: Low Risk  (02/22/2021)   [facility] from [facility] visits prior to 08/17/2022., [facility] visits prior to 08/17/2022.   Overall Financial Resource Strain (CARDIA)    Difficulty of Paying Living Expenses: Not hard at all  Food Insecurity: No Food Insecurity (02/22/2021)   Received from [facility] visits prior to 08/17/2022., [facility] visits prior to 08/17/2022.   Hunger Vital Sign    Worried About Running Out of Food in the Last Year: Never true    Ran Out of Food in the Last Year: Never true  Transportation Needs: No Transportation Needs (02/22/2021)   [facility] from [facility] visits prior to 08/17/2022., [facility] visits prior to 08/17/2022.   PRAPARE - Administrator, Civil Service (Medical): No    Lack of Transportation (Non-Medical): No   Physical Activity: Sufficiently Active (02/22/2021)   Received from [facility] visits prior to 08/17/2022., [facility] visits prior to 08/17/2022.   Exercise Vital Sign    Days of Exercise per Week: 4 days    Minutes of Exercise per Session: 60 min  Stress: Not on file  Social Connections: Not on file  Intimate Partner Violence: Not on file   Social History   Tobacco Use  Smoking Status Never  Smokeless Tobacco Never   Social History   Substance and Sexual Activity  Alcohol Use Yes   Comment: social on occ    Family History  Adopted: Yes  Family history unknown: Yes     ROS: Denies fever, fatigue, unexplained weight loss/gain, hearing or vision changes, cardiac or respiratory complaints. Denies neurological deficits, musculoskeletal complaints, gastrointestinal or genitourinary complaints, mental health complaints, and skin changes.   Objective:   Today's Vitals   05/09/23 0830  BP: 116/72  Pulse: 75  Temp: 97.7 F (36.5 C)  TempSrc: Temporal  SpO2: 96%  Weight: 237 lb 9.6 oz (107.8 kg)  Height: 5\' 10"  (1.778 m)    GENERAL APPEARANCE: Well-appearing, in NAD. Well nourished.  SKIN: Pink, warm and dry. Turgor normal. No rash, lesion, ulceration, or ecchymoses. Hair evenly distributed.  HEENT: HEAD: Normocephalic.  EYES: PERRLA. EOMI. Lids intact w/o defect. Sclera white, Conjunctiva pink w/o exudate.  EARS: External ear w/o redness, swelling, masses or lesions. EAC clear. TM's intact, translucent w/o bulging, appropriate landmarks visualized. Appropriate acuity to conversational tones.  NOSE: Septum midline w/o deformity. Nares patent, mucosa pink and non-inflamed w/o drainage. No sinus tenderness.  THROAT: Uvula midline. Oropharynx clear. Tonsils absent. Oral mucosa pink and moist.  NECK: Supple, Trachea midline. Full ROM w/o pain or tenderness. No lymphadenopathy. Thyroid non-tender w/o enlargement or palpable masses.   BREASTS: Breasts pendulous, symmetrical, and w/o palpable masses. Nipples everted and w/o discharge. No rash or skin retraction. No axillary or supraclavicular lymphadenopathy.  RESPIRATORY: Chest wall symmetrical w/o masses. Respirations even and non-labored. Breath sounds clear to auscultation bilaterally. No wheezes, rales, rhonchi, or crackles. CARDIAC: S1, S2 present, regular rate and rhythm. No gallops, murmurs, rubs, or clicks. No carotid bruits. Capillary refill <2 seconds. Peripheral pulses 2+ bilaterally. GI: Abdomen soft w/o distention. Normoactive bowel sounds. No palpable masses or tenderness. No guarding or rebound tenderness. Liver and spleen w/o tenderness or enlargement. No CVA tenderness.  MSK: Muscle tone and strength appropriate for age, w/o atrophy or abnormal movement.  EXTREMITIES: Active ROM intact, w/o tenderness, crepitus, or contracture. No obvious joint deformities or effusions. No clubbing, edema, or cyanosis.  NEUROLOGIC: CN's II-XII intact. Motor strength symmetrical with no obvious weakness. No sensory deficits. Steady, even gait.  PSYCH/MENTAL STATUS: Alert, oriented x 3. Cooperative, appropriate mood and affect.    Assessment & Plan:  1. Encounter for general adult medical examination without abnormal findings - CBC with Differential/Platelet - Comprehensive metabolic panel - TSH - Lipid panel  2. Breast cancer screening by mammogram - MM 3D SCREENING MAMMOGRAM BILATERAL BREAST; Future  3. Perimenopausal vasomotor symptoms - Follicle stimulating hormone - Prolactin  4. Vitamin B12 deficiency - Vitamin B12   Orders Placed This Encounter  Procedures   MM 3D SCREENING MAMMOGRAM BILATERAL BREAST    Standing Status:   Future    Standing Expiration Date:   05/08/2024    Order Specific Question:   Reason for Exam (SYMPTOM  OR DIAGNOSIS REQUIRED)    Answer:   screening for breast cancer    Order Specific Question:   Preferred imaging location?     Answer:   [facility]    Order Specific Question:   Is the patient pregnant?    Answer:   No   CBC with Differential/Platelet   Comprehensive metabolic panel   TSH   Lipid panel   Follicle stimulating hormone   Vitamin B12   Prolactin    PATIENT COUNSELING:  - Encouraged a healthy well-balanced diet. Patient may adjust caloric intake to maintain or achieve ideal body weight. May reduce intake of dietary saturated fat and total fat and have adequate dietary potassium  and calcium preferably from fresh fruits, vegetables, and low-fat dairy products.   - Advised to avoid cigarette smoking. - Discussed with the patient that most people either abstain from alcohol or drink within safe limits (<=14/week and <=4 drinks/occasion for males, <=7/weeks and <= 3 drinks/occasion for females) and that the risk for alcohol disorders and other health effects rises proportionally with the number of drinks per week and how often a drinker exceeds daily limits. - Discussed cessation/primary prevention of drug use and availability of treatment for abuse.  - Discussed sexually transmitted diseases, avoidance of unintended pregnancy and contraceptive alternatives.  - Stressed the importance of regular exercise - Injury prevention: Discussed safety belts, safety helmets, smoke detector, smoking near bedding or upholstery.  - Dental health: Discussed importance of regular tooth brushing, flossing, and dental visits.   NEXT PREVENTATIVE PHYSICAL DUE IN 1 YEAR.  Return in about 6 months (around 11/06/2023) for Chronic Condition follow up, ADD/ADHD.  Salvatore Decent, FNP

## 2023-05-10 LAB — PROLACTIN: Prolactin: 7.1 ng/mL

## 2023-05-19 ENCOUNTER — Encounter: Payer: Self-pay | Admitting: Internal Medicine

## 2023-05-19 DIAGNOSIS — E538 Deficiency of other specified B group vitamins: Secondary | ICD-10-CM

## 2023-05-19 DIAGNOSIS — E7800 Pure hypercholesterolemia, unspecified: Secondary | ICD-10-CM

## 2023-05-21 MED ORDER — CYANOCOBALAMIN 1000 MCG/ML IJ SOLN: 1000.0000 ug | INTRAMUSCULAR | Status: AC

## 2023-05-21 MED ORDER — ATORVASTATIN CALCIUM 20 MG PO TABS: 20.0000 mg | ORAL_TABLET | Freq: Every day | ORAL | 1 refills | Status: DC

## 2023-06-30 ENCOUNTER — Inpatient Hospital Stay: Admission: RE | Admit: 2023-06-30 | Payer: Commercial Managed Care - HMO | Source: Ambulatory Visit

## 2023-07-18 ENCOUNTER — Ambulatory Visit: Payer: Self-pay | Admitting: Internal Medicine

## 2023-07-18 NOTE — Telephone Encounter (Signed)
Patient was transferred to nurse triage and then nurse triage sent a message to the office.  I called patient and got her voicemail and left patient a detailed message to seek care at her nearest urgent care or emergency room.

## 2023-07-18 NOTE — Telephone Encounter (Signed)
  Chief Complaint: Left foot swelling Symptoms: tingling, itching, swelling Frequency: Ongoing for "several weeks" Pertinent Negatives: Patient denies fever, CP, SOB, calf pain Disposition: [] ED /[] Urgent Care (no appt availability in office) / [x] Appointment(In office/virtual)/ []  [facility]/ [] Home Care/ [x] Refused Recommended Disposition /[] [facility]/ []  Follow-up with PCP Additional Notes: Pt reports she started with left foot swelling and itching several weeks ago, she notes the foot became red and mildly warm. Denies pain but reports "tingling that hurts, like when your foot falls asleep". She notes the next episode involved the entire left side of her body, notes it has become more frequent over the last couple weeks. Pt denies fever, SOB, CP, calf pain. Advised OV in 3 days, pt advises she would prefer to see PCP and has limited availability due to school. Pt agreeable to call back or see ED with new/worsening symptoms prior to appt. Scheduled 02/14. This RN educated pt on home care, new-worsening symptoms, when to call back/seek emergent care. Pt verbalized understanding and agrees to plan.    Copied from CRM [id number]. Topic: Clinical - Red Word Triage >> Jul 18, 2023  4:31 PM Truddie Crumble wrote: Red Word that prompted transfer to Nurse Triage: feet does not feel like the blood is circulating good and artitis in her hands. patient was taking atorvastatin but she stopped it a month ago because it was hurting every joint in her body Reason for Disposition  [1] MILD swelling of both ankles (i.e., pedal edema) AND [2] new-onset or worsening  Answer Assessment - Initial Assessment Questions 1. ONSET: "When did the swelling start?" (e.g., minutes, hours, days)     Past couple weeks 2. LOCATION: "What part of the leg is swollen?"  "Are both legs swollen or just one leg?"     Left foot/lower leg 3. SEVERITY: "How bad is the swelling?" (e.g., localized; mild, moderate,  severe)   - Localized: Small area of swelling localized to one leg.   - MILD pedal edema: Swelling limited to foot and ankle, pitting edema < 1/4 inch (6 mm) deep, rest and elevation eliminate most or all swelling.   - MODERATE edema: Swelling of lower leg to knee, pitting edema > 1/4 inch (6 mm) deep, rest and elevation only partially reduce swelling.   - SEVERE edema: Swelling extends above knee, facial or hand swelling present.      Skin felt tight 4. REDNESS: "Does the swelling look red or infected?"     Left foot looked red  5. PAIN: "Is the swelling painful to touch?" If Yes, ask: "How painful is it?"   (Scale 1-10; mild, moderate or severe)     None 6. FEVER: "Do you have a fever?" If Yes, ask: "What is it, how was it measured, and when did it start?"      None 7. CAUSE: "What do you think is causing the leg swelling?"     Unknown 10. OTHER SYMPTOMS: "Do you have any other symptoms?" (e.g., chest pain, difficulty breathing)       "Weird numbness that tingles and hurts"  Protocols used: Leg Swelling and Edema-A-AH

## 2023-07-31 NOTE — Progress Notes (Deleted)
 [facility] [address] [place] [address] Dept: [phone] Dept Fax: [phone]    Subjective:   Melissa Dodson [birth year] 08/01/2023  No chief complaint on file.   HPI: Melissa Dodson presents today for re-assessment and management of chronic medical conditions.  Discussed the use of AI scribe software for clinical note transcription with the patient, who gave verbal consent to proceed.  History of Present Illness               The following portions of the patient's history were reviewed and updated as appropriate: past medical history, past surgical history, family history, social history, allergies, medications, and problem list.   Patient Active Problem List   Diagnosis Date Noted   Left hand pain 03/25/2023   Arthritis of carpometacarpal joint 03/19/2023   Plantar fasciitis 12/25/2022   Entrapment of right ulnar nerve 12/25/2022   Ganglion cyst of dorsum of right wrist 12/25/2022   Pain of left hip 12/25/2022   Bone spur of other site 12/25/2022   Primary osteoarthritis of both knees 12/25/2022   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   Breast pain, left 12/25/2022   Tightness of left heel cord 09/05/2021   SI (sacroiliac) pain 11/01/2019   Spondylosis without myelopathy or radiculopathy, cervical region 08/02/2019   Ulnar neuropathy of left upper extremity 01/08/2019   Tendinitis of left rotator cuff 01/08/2019   Lumbar radiculopathy 12/11/2018   Patellofemoral stress syndrome of left knee 09/04/2018   Pure hypercholesterolemia 04/06/2018   Class 3 severe obesity due to excess calories with serious comorbidity and body mass index (BMI) of 40.0 to 44.9 in adult (HCC) 02/19/2018   Abnormal cervical Pap smear with positive HPV DNA test 05/10/2015   Reactive depression 04/19/2015   Chronic asthma without complication 04/18/2015   Anaphylactic reaction 04/12/2015    Past Medical History:  Diagnosis Date   Asthma with exacerbation 09/01/2014   Depression    Headache    History of laparoscopic adjustable gastric banding-done in [place] 01/26/2014   No info on type of band   Hypoxia    S/P gastric surgery 09/01/2014   Status post laparoscopic cholecystectomy August 2015 01/28/2014   Past Surgical History:  Procedure Laterality Date   ABDOMINAL HYSTERECTOMY     CHOLECYSTECTOMY N/A 01/28/2014   Procedure: LAPAROSCOPIC CHOLECYSTECTOMY WITH INTRAOPERATIVE CHOLANGIOGRAM;  Surgeon: Valarie Merino, MD;  Location: [facility];  Service: General;  Laterality: N/A;   KNEE SURGERY     LAPAROSCOPIC GASTRIC BANDING N/A 2013   performed in [place]   TONSILLECTOMY     Family History  Adopted: Yes  Family history unknown: Yes    Current Outpatient Medications:    amphetamine-dextroamphetamine (ADDERALL) 20 MG tablet, Take 1 tablet (20 mg total) by mouth 2 (two) times daily., Disp: 60 tablet, Rfl: 0   amphetamine-dextroamphetamine (ADDERALL) 20 MG tablet, Take 1 tablet (20 mg total) by mouth 2 (two) times daily., Disp: 60 tablet, Rfl: 0   amphetamine-dextroamphetamine (ADDERALL) 20 MG tablet, Take 1 tablet (20 mg total) by mouth 2 (two) times daily., Disp: 60 tablet, Rfl: 0   atorvastatin (LIPITOR) 20 MG tablet, Take 1 tablet (20 mg total) by mouth daily., Disp: 90 tablet, Rfl: 1   BIOTIN PO, Take by mouth daily., Disp: , Rfl:    EPINEPHrine 0.3 mg/0.3 mL IJ SOAJ injection, Inject 0.3 mLs (0.3 mg total) into the muscle once., Disp: 1 Device, Rfl: 1   meloxicam (MOBIC) 15 MG  tablet, Take 1 tablet (15 mg total) by mouth daily. (Patient not taking: Reported on 05/09/2023), Disp: 30 tablet, Rfl: 0   methylPREDNISolone (MEDROL DOSEPAK) 4 MG TBPK tablet, Take by mouth., Disp: , Rfl:  Allergies  Allergen Reactions   Nystatin Anaphylaxis and Swelling    Patient broke out in rash on extremities, had swelling of the nasal passage, throat and lips. Reaction was increasing  over the course of 1-3 days.   Mango Flavoring Agent (Non-Screening) Swelling   Other Hives   Sulfa Antibiotics Hives   Poison Ivy Extract Rash     ROS: A complete ROS was performed with pertinent positives/negatives noted in the HPI. The remainder of the ROS are negative.    Objective:   There were no vitals filed for this visit.  GENERAL: Well-appearing, in NAD. Well nourished.  SKIN: Pink, warm and dry. No rash, lesion, ulceration, or ecchymoses.  NECK: Trachea midline. Full ROM w/o pain or tenderness. No lymphadenopathy.  RESPIRATORY: Chest wall symmetrical. Respirations even and non-labored. Breath sounds clear to auscultation bilaterally.  CARDIAC: S1, S2 present, regular rate and rhythm. Peripheral pulses 2+ bilaterally.  MSK: Muscle tone and strength appropriate for age. Joints w/o tenderness, redness, or swelling.  EXTREMITIES: Without clubbing, cyanosis, or edema.  NEUROLOGIC: No motor or sensory deficits. Steady, even gait.  PSYCH/MENTAL STATUS: Alert, oriented x 3. Cooperative, appropriate mood and affect.   Health Maintenance Due  Topic Date Due   HIV Screening  Never done   Pneumococcal Vaccine 68-52 Years old (2 of 2 - PCV) 04/20/2016   MAMMOGRAM  12/03/2020   Zoster Vaccines- Shingrix (1 of 2) Never done    No results found for any visits on 08/01/23.  The 10-year ASCVD risk score (Arnett DK, et al., 2019) is: 1.6%     Assessment & Plan:  Assessment and Plan              There are no diagnoses linked to this encounter. No orders of the defined types were placed in this encounter.  No images are attached to the encounter or orders placed in the encounter. No orders of the defined types were placed in this encounter.   No follow-ups on file.   Salvatore Decent, FNP

## 2023-08-01 ENCOUNTER — Telehealth: Payer: Self-pay | Admitting: Internal Medicine

## 2023-08-01 ENCOUNTER — Ambulatory Visit: Payer: Commercial Managed Care - HMO | Admitting: Internal Medicine

## 2023-08-11 ENCOUNTER — Ambulatory Visit: Payer: Commercial Managed Care - HMO | Admitting: Internal Medicine

## 2023-08-11 ENCOUNTER — Encounter: Payer: Self-pay | Admitting: Internal Medicine

## 2023-08-11 VITALS — BP 136/88 | HR 104 | Temp 97.9°F | Ht 70.0 in | Wt 239.6 lb

## 2023-08-11 DIAGNOSIS — E7800 Pure hypercholesterolemia, unspecified: Secondary | ICD-10-CM | POA: Diagnosis not present

## 2023-08-11 DIAGNOSIS — M150 Primary generalized (osteo)arthritis: Secondary | ICD-10-CM | POA: Diagnosis not present

## 2023-08-11 DIAGNOSIS — R200 Anesthesia of skin: Secondary | ICD-10-CM | POA: Diagnosis not present

## 2023-08-11 MED ORDER — PRAVASTATIN SODIUM 10 MG PO TABS: 10.0000 mg | ORAL_TABLET | Freq: Every day | ORAL | 0 refills | Status: DC

## 2023-08-11 NOTE — Patient Instructions (Addendum)
 Glucosamine - joint health supplement   Follow up with orthopedics

## 2023-08-11 NOTE — Progress Notes (Signed)
 [facility] [address] [place] [address] Dept: [phone] Dept Fax: [phone]    Subjective:   Melissa Dodson 1971-02-23 08/11/2023  Chief Complaint  Patient presents with   Hand Pain    Hand stiffness Circulation Joint Shoulder discomfort     HPI: Melissa Dodson presents today for re-assessment and management of chronic medical conditions.  Discussed the use of AI scribe software for clinical note transcription with the patient, who gave verbal consent to proceed.  History of Present Illness   The patient presents with a change in color and sensation in the left leg. The patient reports that the left leg often feels itchy, burny, and numb, with these symptoms coming and going over the past few months. The patient also notes that the left leg sometimes appears redder and feels tighter than the right leg.  In addition to the leg symptoms, the patient reports worsening joint pain in the hands, ankles, and knees. History of OA. The patient describes the hand pain as particularly severe, with the hands feeling "stuck" and painful in the mornings and evenings. 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 She was seen for ortho in the past for this, and had to undergo anesthesia with ortho for shoulder manipulation to break up scar tissue.   The patient also reports discontinuing statin medication for HLD due to worsening severe joint pain that felt like broken bones. The patient reports that some joint pain has persisted even after discontinuing the medication.        The following portions of the patient's history were reviewed and updated as appropriate: past medical history, past surgical history, family history, social history, allergies, medications, and problem list.   Patient Active Problem List   Diagnosis Date Noted    Left hand pain 03/25/2023   Arthritis of carpometacarpal joint 03/19/2023   Plantar fasciitis 12/25/2022   Entrapment of right ulnar nerve 12/25/2022   Ganglion cyst of dorsum of right wrist 12/25/2022   Pain of left hip 12/25/2022   Bone spur of other site 12/25/2022   Primary osteoarthritis of both knees 12/25/2022   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   Breast pain, left 12/25/2022   Tightness of left heel cord 09/05/2021   SI (sacroiliac) pain 11/01/2019   Spondylosis without myelopathy or radiculopathy, cervical region 08/02/2019   Ulnar neuropathy of left upper extremity 01/08/2019   Tendinitis of left rotator cuff 01/08/2019   Lumbar radiculopathy 12/11/2018   Patellofemoral stress syndrome of left knee 09/04/2018   Pure hypercholesterolemia 04/06/2018   Class 3 severe obesity due to excess calories with serious comorbidity and body mass index (BMI) of 40.0 to 44.9 in adult (HCC) 02/19/2018   Abnormal cervical Pap smear with positive HPV DNA test 05/10/2015   Reactive depression 04/19/2015   Chronic asthma without complication 04/18/2015   Anaphylactic reaction 04/12/2015   Past Medical History:  Diagnosis Date   Asthma with exacerbation 09/01/2014   Depression    Headache    History of laparoscopic adjustable gastric banding-done in [place] 01/26/2014   No info on type of band   Hypoxia    S/P gastric surgery 09/01/2014   Status post laparoscopic cholecystectomy August 2015 01/28/2014   Past Surgical History:  Procedure Laterality Date   ABDOMINAL HYSTERECTOMY     CHOLECYSTECTOMY N/A 01/28/2014   Procedure: LAPAROSCOPIC CHOLECYSTECTOMY WITH INTRAOPERATIVE CHOLANGIOGRAM;  Surgeon: Valarie Merino, MD;  Location: [facility];  Service: General;  Laterality: N/A;   KNEE SURGERY     LAPAROSCOPIC GASTRIC BANDING N/A 2013   performed in [place]   TONSILLECTOMY     Family History  Adopted: Yes  Family history unknown: Yes    Current  Outpatient Medications:    amphetamine-dextroamphetamine (ADDERALL) 20 MG tablet, Take 1 tablet (20 mg total) by mouth 2 (two) times daily., Disp: 60 tablet, Rfl: 0   BIOTIN PO, Take by mouth daily., Disp: , Rfl:    pravastatin (PRAVACHOL) 10 MG tablet, Take 1 tablet (10 mg total) by mouth daily., Disp: 30 tablet, Rfl: 0   Turmeric (QC TUMERIC COMPLEX PO), Take by mouth., Disp: , Rfl:    amphetamine-dextroamphetamine (ADDERALL) 20 MG tablet, Take 1 tablet (20 mg total) by mouth 2 (two) times daily., Disp: 60 tablet, Rfl: 0   amphetamine-dextroamphetamine (ADDERALL) 20 MG tablet, Take 1 tablet (20 mg total) by mouth 2 (two) times daily., Disp: 60 tablet, Rfl: 0   EPINEPHrine 0.3 mg/0.3 mL IJ SOAJ injection, Inject 0.3 mLs (0.3 mg total) into the muscle once., Disp: 1 Device, Rfl: 1 Allergies  Allergen Reactions   Nystatin Anaphylaxis and Swelling    Patient broke out in rash on extremities, had swelling of the nasal passage, throat and lips. Reaction was increasing over the course of 1-3 days.   Mango Flavoring Agent (Non-Screening) Swelling   Other Hives   Sulfa Antibiotics Hives   Poison Ivy Extract Rash     ROS: A complete ROS was performed with pertinent positives/negatives noted in the HPI. The remainder of the ROS are negative.    Objective:   Today's Vitals   08/11/23 1051  BP: 136/88  Pulse: (!) 104  Temp: 97.9 F (36.6 C)  TempSrc: Temporal  SpO2: 99%  Weight: 239 lb 9.6 oz (108.7 kg)  Height: 5\' 10"  (1.778 m)    GENERAL: Well-appearing, in NAD. Well nourished.  SKIN: Pink, warm and dry. No rash, lesion, ulceration, or ecchymoses.  RESPIRATORY: Respirations even and non-labored. CARDIAC: S1, S2 present, regular rate and rhythm. Peripheral pulses 2+ bilaterally.  MSK: Muscle tone and strength appropriate for age. Joints without redness, or swelling.  EXTREMITIES: Without clubbing, cyanosis, or edema.  NEUROLOGIC: Steady, even gait.  PSYCH/MENTAL STATUS: Alert,  oriented x 3. Cooperative, appropriate mood and affect.   Health Maintenance Due  Topic Date Due   HIV Screening  Never done   Pneumococcal Vaccine 12-33 Years old (2 of 2 - PCV) 04/20/2016   MAMMOGRAM  12/03/2020   Zoster Vaccines- Shingrix (1 of 2) Never done    No results found for any visits on 08/11/23.  The 10-year ASCVD risk score (Arnett DK, et al., 2019) is: 2.2%     Assessment & Plan:  Assessment and Plan    Numbness in Left Lower Extremity Intermittent redness, itchiness, burning sensation, and numbness in the left leg and foot. No clear triggers or duration. No noted temperature changes or significant swelling. -Order an Venous Duplex US  Hyperlipidemia Statin intolerance with atorvastatin causing joint pain. -Discontinue atorvastatin. Re-try alternate statin therapy -Start pravastatin 10mg  at night. -Notify provider if joint pain worsens.  Osteoarthritis Chronic joint pain in hands, knees, ankles, and left shoulder. Morning stiffness in hands. Pain in left hip previously managed with injections. -Refer to orthopedics for possible joint injections and imaging. Patient is already established with [facility], she will call to schedule appointment.  -Continue current pain  management strategies (ibuprofen, heat, massage). -Try glucosamine supplement. -Consider use of arthritis gloves.      No orders of the defined types were placed in this encounter.  No images are attached to the encounter or orders placed in the encounter. Meds ordered this encounter  Medications   pravastatin (PRAVACHOL) 10 MG tablet    Sig: Take 1 tablet (10 mg total) by mouth daily.    Dispense:  30 tablet    Refill:  0    Supervising Provider:   Garnette Gunner [[id number]]    Return in about 3 months (around 11/08/2023) for Cholesterol, ADHD - fasting lab work at appointment.   Salvatore Decent, FNP

## 2023-08-12 NOTE — Telephone Encounter (Signed)
 Provider aware

## 2023-08-12 NOTE — Telephone Encounter (Signed)
 08/01/2023 1st no show. Pt was seen 08/11/2023. Letter sent via mychart.

## 2023-08-22 ENCOUNTER — Telehealth: Payer: Self-pay

## 2023-08-22 ENCOUNTER — Other Ambulatory Visit: Payer: Self-pay | Admitting: Internal Medicine

## 2023-08-22 DIAGNOSIS — F988 Other specified behavioral and emotional disorders with onset usually occurring in childhood and adolescence: Secondary | ICD-10-CM

## 2023-08-22 NOTE — Telephone Encounter (Signed)
 Copied from CRM [id number]. Topic: General - Other >> Aug 22, 2023  4:37 PM Eunice Blase wrote: Reason for CRM: Pt called to inform Salvatore Decent pt did get cooper gloves and its improved.

## 2023-08-22 NOTE — Telephone Encounter (Signed)
 Copied from CRM [id number]. Topic: Clinical - Medication Refill >> Aug 22, 2023  4:33 PM Eunice Blase wrote: Most Recent Primary Care Visit:  Provider: Salvatore Decent  Department: [facility]  Visit Type: ACUTE  Date: 08/11/2023  Medication: amphetamine-dextroamphetamine (ADDERALL) 20 MG tablet  Has the patient contacted their pharmacy? Yes (Agent: If no, request that the patient contact the pharmacy for the refill. If patient does not wish to contact the pharmacy document the reason why and proceed with request.) (Agent: If yes, when and what did the pharmacy advise?)Pharmacy   Is this the correct pharmacy for this prescription? Yes If no, delete pharmacy and type the correct one.  This is the patient's preferred pharmacy:  [facility] [address] [place], [place] - [address] [place] [address] Phone: [phone] Fax: [phone]     Has the prescription been filled recently? Yes  Is the patient out of the medication? Yes  Has the patient been seen for an appointment in the last year OR does the patient have an upcoming appointment? Yes  Can we respond through MyChart? Yes  Agent: Please be advised that Rx refills may take up to 3 business days. We ask that you follow-up with your pharmacy.

## 2023-08-25 ENCOUNTER — Other Ambulatory Visit: Payer: Self-pay | Admitting: Internal Medicine

## 2023-08-25 DIAGNOSIS — F988 Other specified behavioral and emotional disorders with onset usually occurring in childhood and adolescence: Secondary | ICD-10-CM

## 2023-08-25 MED ORDER — AMPHETAMINE-DEXTROAMPHETAMINE 20 MG PO TABS: 20.0000 mg | ORAL_TABLET | Freq: Two times a day (BID) | ORAL | 0 refills | Status: DC

## 2023-08-25 NOTE — Telephone Encounter (Signed)
 Last Ov 08/11/23 Filled 04/28/23

## 2023-08-29 ENCOUNTER — Ambulatory Visit (HOSPITAL_COMMUNITY)
Admission: RE | Admit: 2023-08-29 | Discharge: 2023-08-29 | Disposition: A | Discharge status: Home / Self Care | Payer: Commercial Managed Care - HMO | Source: Ambulatory Visit | Attending: Internal Medicine | Admitting: Internal Medicine

## 2023-08-29 DIAGNOSIS — R200 Anesthesia of skin: Secondary | ICD-10-CM | POA: Diagnosis present

## 2023-08-29 LAB — VAS US ABI WITH/WO TBI
Left ABI: 1.25
Right ABI: 1.11

## 2023-09-04 ENCOUNTER — Encounter: Payer: Self-pay | Admitting: Internal Medicine

## 2023-09-05 ENCOUNTER — Ambulatory Visit: Admitting: Surgical

## 2023-09-05 ENCOUNTER — Encounter: Payer: Self-pay | Admitting: Surgical

## 2023-09-05 ENCOUNTER — Other Ambulatory Visit (INDEPENDENT_AMBULATORY_CARE_PROVIDER_SITE_OTHER): Payer: Self-pay

## 2023-09-05 ENCOUNTER — Other Ambulatory Visit: Payer: Self-pay

## 2023-09-05 DIAGNOSIS — M25512 Pain in left shoulder: Secondary | ICD-10-CM | POA: Diagnosis not present

## 2023-09-05 DIAGNOSIS — G8929 Other chronic pain: Secondary | ICD-10-CM | POA: Diagnosis not present

## 2023-09-05 DIAGNOSIS — M79644 Pain in right finger(s): Secondary | ICD-10-CM

## 2023-09-05 DIAGNOSIS — M25552 Pain in left hip: Secondary | ICD-10-CM | POA: Diagnosis not present

## 2023-09-05 DIAGNOSIS — M79645 Pain in left finger(s): Secondary | ICD-10-CM

## 2023-09-05 DIAGNOSIS — M7502 Adhesive capsulitis of left shoulder: Secondary | ICD-10-CM | POA: Diagnosis not present

## 2023-09-05 MED ORDER — TRAMADOL HCL 50 MG PO TABS
50.0000 mg | ORAL_TABLET | Freq: Two times a day (BID) | ORAL | 0 refills | Status: AC | PRN
Start: 2023-09-05 — End: ?

## 2023-09-07 ENCOUNTER — Encounter: Payer: Self-pay | Admitting: Surgical

## 2023-09-07 LAB — CBC WITH DIFFERENTIAL/PLATELET
Absolute Lymphocytes: 2527 {cells}/uL (ref 850–3900)
Absolute Monocytes: 346 {cells}/uL (ref 200–950)
Basophils Absolute: 50 {cells}/uL (ref 0–200)
Basophils Relative: 0.7 %
Eosinophils Absolute: 86 {cells}/uL (ref 15–500)
Eosinophils Relative: 1.2 %
HCT: 46.3 % — ABNORMAL HIGH (ref 35.0–45.0)
Hemoglobin: 15.4 g/dL (ref 11.7–15.5)
MCH: 28.7 pg (ref 27.0–33.0)
MCHC: 33.3 g/dL (ref 32.0–36.0)
MCV: 86.4 fL (ref 80.0–100.0)
MPV: 9.4 fL (ref 7.5–12.5)
Monocytes Relative: 4.8 %
Neutro Abs: 4190 {cells}/uL (ref 1500–7800)
Neutrophils Relative %: 58.2 %
Platelets: 387 10*3/uL (ref 140–400)
RBC: 5.36 10*6/uL — ABNORMAL HIGH (ref 3.80–5.10)
RDW: 12.7 % (ref 11.0–15.0)
Total Lymphocyte: 35.1 %
WBC: 7.2 10*3/uL (ref 3.8–10.8)

## 2023-09-07 LAB — CYCLIC CITRUL PEPTIDE ANTIBODY, IGG: Cyclic Citrullin Peptide Ab: <16 U

## 2023-09-07 LAB — SEDIMENTATION RATE: Sed Rate: 6 mm/h (ref 0–30)

## 2023-09-07 LAB — ANA: Anti Nuclear Antibody (ANA): NEGATIVE

## 2023-09-07 LAB — RHEUMATOID FACTOR: Rheumatoid fact SerPl-aCnc: <10 [IU]/mL (ref <14)

## 2023-09-07 LAB — C-REACTIVE PROTEIN: CRP: <3 mg/L (ref <8.0)

## 2023-09-07 MED ORDER — BUPIVACAINE HCL 0.25 % IJ SOLN
4.0000 mL | INTRAMUSCULAR | Status: AC | PRN
  Administered 2023-09-05: 4 mL via INTRA_ARTICULAR

## 2023-09-07 MED ORDER — LIDOCAINE HCL 1 % IJ SOLN
5.0000 mL | INTRAMUSCULAR | Status: AC | PRN
Start: 2023-09-05 — End: 2023-09-05
  Administered 2023-09-05: 5 mL

## 2023-09-07 MED ORDER — BUPIVACAINE HCL 0.5 % IJ SOLN
9.0000 mL | INTRAMUSCULAR | Status: AC | PRN
  Administered 2023-09-05: 9 mL via INTRA_ARTICULAR

## 2023-09-07 MED ORDER — METHYLPREDNISOLONE ACETATE 40 MG/ML IJ SUSP
40.0000 mg | INTRAMUSCULAR | Status: AC | PRN
  Administered 2023-09-05: 40 mg via INTRA_ARTICULAR

## 2023-09-07 MED ORDER — LIDOCAINE HCL 1 % IJ SOLN
5.0000 mL | INTRAMUSCULAR | Status: AC | PRN
  Administered 2023-09-05: 5 mL

## 2023-09-07 NOTE — Progress Notes (Signed)
 Office Visit Note   Patient: Melissa Dodson           Date of Birth: [birth year]           MRN: [id number] Visit Date: 09/05/2023 Requested by: Salvatore Decent, FNP [address] [place],  [place] [address] PCP: Salvatore Decent, FNP  Subjective: Chief Complaint  Patient presents with   Left Shoulder - Pain    HPI: Melissa Dodson is a 53 y.o. female who presents to the office reporting multiple joint complaints.  She complains of left hip pain localizing to the lateral aspect of the hip without groin pain.  She had prior trochanteric injection in summer 2024 that gave her relief for about 2 to 3 months of 80 to 90%.  She would like to repeat this today.  Does have occasional tingling in the left leg with no consistent low back pain.  She will note the tingling mostly from the knee down to the foot but she denies any radicular pain down the entirety of the leg extending from her hip.  No mechanical symptoms in her hip.  She also complains of left shoulder pain.  She has had symptoms over the last 3 to 4 months without history of injury.  She describes anterior and lateral shoulder pain with occasional radiation to the fingers but most of the pain around the shoulder region.  She has history of prior manipulation under anesthesia with physical therapy treatment of frozen shoulder about 4 years ago back in 2020.  She notes pain is worse with activity currently.  She cannot sleep on the side.  She is right-hand dominant.    She also complains of bilateral hand pain mostly localizing to her fingers.  She states that when she wakes up in the morning she has a lot of pain and stiffness and pretty much all of her DIP and PIP joints bilaterally.  She has only noticed this over the last 2 to 3 months.  She denies any triggering sensation.  Her hands will decrease in stiffness over the course of the morning.  She is adopted so she is unsure if she has any family history of rheumatoid arthritis.  No  history of prior surgery to her hands.  .                ROS: All systems reviewed are negative as they relate to the chief complaint within the history of present illness.  Patient denies fevers or chills.  Assessment & Plan: Visit Diagnoses:  1. Chronic left shoulder pain   2. Pain in left hip     Plan: Patient is a 53 year old female who presents for evaluation of left hip and left shoulder pain as well as bilateral hand pain.  Left hip has responded well to trochanteric injection in the past.  We will repeat this today but if she has no significant relief, next step would be MRI of the left hip.  Trochanteric injection administered under ultrasound guidance.  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  This was treated with manipulation under anesthesia and physical therapy with manipulation under anesthesia taking place on 01/26/2019.  Based on her exam today, she may have recurrent frozen shoulder which is very uncommon.  She does report that she had regained her full range of motion of her left shoulder after treatment but this recent shoulder stiffness and pain is definitely new for her in the last few months.  We discussed options for her and she would like to try glenohumeral injection.  We will try this and see her back in 4 weeks for clinical recheck to see how the combination of cortisone injection and home exercise program focusing on shoulder range of motion exercises has helped with her shoulder motion.  If she has continued shoulder pain and stiffness, may need to consider MRI arthrogram of the left shoulder given the unusual nature of recurrence of frozen shoulder.  She also has bilateral hand stiffness primarily in the DIP and PIP joints.  With no history of injury, we will check for any laboratory findings that could suggest rheumatologic cause.  No family history of rheumatoid arthritis that she knows of though she is adopted.  Follow-Up  Instructions: No follow-ups on file.   Orders:  Orders Placed This Encounter  Procedures   XR Shoulder Left   US Guided Needle Placement - No Linked Charges   Sedimentation rate   C-reactive protein   Rheumatoid Factor   ANA   Cyclic citrul peptide antibody, IgG   CBC with Differential/Platelet   Meds ordered this encounter  Medications   traMADol (ULTRAM) 50 MG tablet    Sig: Take 1 tablet (50 mg total) by mouth every 12 (twelve) hours as needed.    Dispense:  5 tablet    Refill:  0      Procedures: Large Joint Inj: L greater trochanter on 09/05/2023 3:42 PM Indications: pain and diagnostic evaluation Details: 22 G 3.5 in needle, ultrasound-guided lateral approach  Arthrogram: No  Medications: 5 mL lidocaine 1 %; 4 mL bupivacaine 0.25 %; 40 mg methylPREDNISolone acetate 40 MG/ML Outcome: tolerated well, no immediate complications Procedure, treatment alternatives, risks and benefits explained, specific risks discussed. Consent was given by the patient. Immediately prior to procedure a time out was called to verify the correct patient, procedure, equipment, support staff and site/side marked as required. Patient was prepped and draped in the usual sterile fashion.    Large Joint Inj: L glenohumeral on 09/05/2023 3:42 PM Indications: diagnostic evaluation and pain Details: 22 G 1.5 in and 3.5 in needle, ultrasound-guided posterior approach  Arthrogram: No  Medications: 9 mL bupivacaine 0.5 %; 40 mg methylPREDNISolone acetate 40 MG/ML; 5 mL lidocaine 1 % Outcome: tolerated well, no immediate complications Procedure, treatment alternatives, risks and benefits explained, specific risks discussed. Consent was given by the patient. Immediately prior to procedure a time out was called to verify the correct patient, procedure, equipment, support staff and site/side marked as required. Patient was prepped and draped in the usual sterile fashion.       Clinical Data: No  additional findings.  Objective: Vital Signs: There were no vitals taken for this visit.  Physical Exam:  Constitutional: Patient appears well-developed HEENT:  Head: Normocephalic Eyes:EOM are normal Neck: Normal range of motion Cardiovascular: Normal rate Pulmonary/chest: Effort normal Neurologic: Patient is alert Skin: Skin is warm Psychiatric: Patient has normal mood and affect  Ortho Exam: Ortho exam demonstrates left shoulder with 45 degrees X rotation, 90 degrees abduction, 165 degrees forward elevation passively and actively.  This compared with the right shoulder with 80 degrees X rotation, 130 degrees abduction, 180 degrees forward elevation passively and actively.  Excellent rotator cuff strength of right and left shoulders.  Axillary nerves intact with deltoid firing in the left shoulder.  She does have some moderate tenderness over the bicipital groove.  No significant tenderness over the [facility] joint bilaterally.  She has no  tenderness over the acromion.  No cellulitis or skin changes noted in the shoulder region.  Intact EPL, FPL, finger abduction, pronation/supination, bicep, tricep, deltoid bilaterally.  Positive O'Brien sign.  No pain reproduced with supination strength testing.  Negative external rotation lag sign.  Negative Hornblower sign.  Negative Spurling sign.  Negative Lhermitte sign.  Left hip with no pain with passive motion.  She has intact hip flexion with excellent strength.  She does have pain reproduced with tenderness over the greater trochanter and she localizes this area as her area of maximal pain.  No cellulitis or skin changes noted in the hip region.  Ambulates without any Trendelenburg gait.  Specialty Comments:  No specialty comments available.  Imaging: No results found.   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   Breast pain, left 12/25/2022   Tightness of left heel cord 09/05/2021   SI (sacroiliac) pain 11/01/2019   Spondylosis without myelopathy or radiculopathy, cervical region 08/02/2019   Ulnar neuropathy of left upper extremity 01/08/2019   Tendinitis of left rotator cuff 01/08/2019   Lumbar radiculopathy 12/11/2018   Patellofemoral stress syndrome of left knee 09/04/2018   Pure hypercholesterolemia 04/06/2018   Class 3 severe obesity due to excess calories with serious comorbidity and body mass index (BMI) of 40.0 to 44.9 in adult (HCC) 02/19/2018   Abnormal cervical Pap smear with positive HPV DNA test 05/10/2015   Reactive depression 04/19/2015   Chronic asthma without complication 04/18/2015   Anaphylactic reaction 04/12/2015   Past Medical History:  Diagnosis Date   Asthma with exacerbation 09/01/2014   Depression    Headache    History of laparoscopic adjustable gastric banding-done in [place] 01/26/2014   No info on type of band   Hypoxia    S/P gastric surgery 09/01/2014   Status post laparoscopic cholecystectomy August 2015 01/28/2014    Family History  Adopted: Yes  Family history unknown: Yes    Past Surgical History:  Procedure Laterality Date   ABDOMINAL HYSTERECTOMY     CHOLECYSTECTOMY N/A 01/28/2014   Procedure: LAPAROSCOPIC CHOLECYSTECTOMY WITH INTRAOPERATIVE CHOLANGIOGRAM;  Surgeon: Valarie Merino, MD;  Location: [facility];  Service: General;  Laterality: N/A;   KNEE SURGERY     LAPAROSCOPIC GASTRIC BANDING N/A 2013   performed in [place]   TONSILLECTOMY     Social History   Occupational History   Not on file  Tobacco Use   Smoking status: Never   Smokeless tobacco: Never   Vaping Use   Vaping status: Never Used  Substance and Sexual Activity   Alcohol use: Yes    Comment: social on occ   Drug use: No   Sexual activity: Yes    Birth control/protection: Surgical

## 2023-09-08 ENCOUNTER — Telehealth: Payer: Self-pay

## 2023-09-08 ENCOUNTER — Encounter (HOSPITAL_BASED_OUTPATIENT_CLINIC_OR_DEPARTMENT_OTHER): Payer: Self-pay

## 2023-09-08 NOTE — Progress Notes (Signed)
 I called Melissa Dodson.  Discussed her lab results.  Can we send referral in for her to see Dr. Fara Boros who she has seen before?  Needs evaluation of bilateral finger stiffness/pain  Shoulder is doing a lot better symptomatically and her hip is doing okay.  She also needs an appointment to come back to see myself or Dr. August Saucer in about 4 to 6 weeks to recheck her left shoulder

## 2023-09-08 NOTE — Telephone Encounter (Signed)
 Needs follow-up

## 2023-09-08 NOTE — Telephone Encounter (Signed)
-----   Message from [facility] sent at 09/07/2023  3:46 PM EDT ----- Cresenciano Lick, can you call Victorino Dike at some point this week just to make her an appointment in 4 weeks for clinical recheck with Dr. August Saucer?

## 2023-09-08 NOTE — Progress Notes (Signed)
 Referral for Dr. Fara Boros was placed in chart

## 2023-09-08 NOTE — Addendum Note (Signed)
 Addended by: Barbette Or on: 09/08/2023 02:54 PM   Modules accepted: Orders

## 2023-09-09 ENCOUNTER — Encounter (HOSPITAL_BASED_OUTPATIENT_CLINIC_OR_DEPARTMENT_OTHER): Payer: Self-pay

## 2023-09-09 NOTE — Telephone Encounter (Signed)
Lvm for pt to cb to schedule f/u

## 2023-09-11 NOTE — Telephone Encounter (Signed)
 FYI I have tried contacting pt multiple times with no answer. Voicemail box is now full

## 2023-09-14 NOTE — Telephone Encounter (Signed)
 Thanks for trying, I did talk with her on the phone several days ago and recommended she see Dr. Fara Boros for her hand pain.  I think she is busy with school to some degree so she will probably be reaching out to Dr. Fara Boros and discussed that pain as well as discuss ganglion cyst excision.

## 2023-11-24 ENCOUNTER — Other Ambulatory Visit: Payer: Self-pay | Admitting: Internal Medicine

## 2023-11-24 DIAGNOSIS — E7800 Pure hypercholesterolemia, unspecified: Secondary | ICD-10-CM

## 2023-11-24 DIAGNOSIS — F988 Other specified behavioral and emotional disorders with onset usually occurring in childhood and adolescence: Secondary | ICD-10-CM

## 2023-11-24 MED ORDER — PRAVASTATIN SODIUM 10 MG PO TABS: 10.0000 mg | ORAL_TABLET | Freq: Every day | ORAL | 0 refills | Status: AC

## 2023-11-24 MED ORDER — AMPHETAMINE-DEXTROAMPHETAMINE 20 MG PO TABS: 20.0000 mg | ORAL_TABLET | Freq: Two times a day (BID) | ORAL | 0 refills | Status: DC

## 2023-11-24 MED ORDER — AMPHETAMINE-DEXTROAMPHETAMINE 20 MG PO TABS
20.0000 mg | ORAL_TABLET | Freq: Two times a day (BID) | ORAL | 0 refills | Status: DC
Start: 2023-12-24 — End: 2024-02-24

## 2023-11-24 NOTE — Telephone Encounter (Unsigned)
 Copied from CRM [id number]. Topic: Clinical - Medication Refill >> Nov 24, 2023  8:41 AM Marlan Silva wrote: Medication:  amphetamine -dextroamphetamine  (ADDERALL) 20 MG tablet  pravastatin  (PRAVACHOL ) 10 MG tablet   Has the patient contacted their pharmacy? Yes (Agent: If no, request that the patient contact the pharmacy for the refill. If patient does not wish to contact the pharmacy document the reason why and proceed with request.) (Agent: If yes, when and what did the pharmacy advise?)  This is the patient's preferred pharmacy:  [facility] [address] - [place], [place] - [address], [address] [place] [address] Phone: [phone] Fax: [phone]  Is this the correct pharmacy for this prescription? Yes If no, delete pharmacy and type the correct one.   Has the prescription been filled recently? Yes  Is the patient out of the medication? Yes  Has the patient been seen for an appointment in the last year OR does the patient have an upcoming appointment? Yes  Can we respond through MyChart? Yes  Agent: Please be advised that Rx refills may take up to 3 business days. We ask that you follow-up with your pharmacy.

## 2023-11-24 NOTE — Telephone Encounter (Signed)
 Last Ov 08/11/23 Filled 08/25/23

## 2024-02-23 ENCOUNTER — Other Ambulatory Visit: Payer: Self-pay | Admitting: Internal Medicine

## 2024-02-23 DIAGNOSIS — F988 Other specified behavioral and emotional disorders with onset usually occurring in childhood and adolescence: Secondary | ICD-10-CM

## 2024-02-23 NOTE — Telephone Encounter (Signed)
 Last Ov 08/11/23 Filled 11/24/23

## 2024-02-24 ENCOUNTER — Other Ambulatory Visit: Payer: Self-pay | Admitting: Internal Medicine

## 2024-02-24 DIAGNOSIS — F988 Other specified behavioral and emotional disorders with onset usually occurring in childhood and adolescence: Secondary | ICD-10-CM

## 2024-02-24 MED ORDER — AMPHETAMINE-DEXTROAMPHETAMINE 20 MG PO TABS: 20.0000 mg | ORAL_TABLET | Freq: Two times a day (BID) | ORAL | 0 refills | Status: DC

## 2024-02-24 MED ORDER — AMPHETAMINE-DEXTROAMPHETAMINE 20 MG PO TABS: 20.0000 mg | ORAL_TABLET | Freq: Two times a day (BID) | ORAL | 0 refills | Status: AC

## 2024-04-19 ENCOUNTER — Encounter: Payer: Self-pay | Admitting: Radiology

## 2024-04-26 ENCOUNTER — Ambulatory Visit: Payer: PRIVATE HEALTH INSURANCE | Admitting: Internal Medicine

## 2024-04-26 NOTE — Progress Notes (Deleted)
 [facility] [address] [place] [address] Dept: [phone] Dept Fax: [phone]  Acute Care Office Visit  Subjective:   Dayelin Balducci Apr 16, 1971 04/26/2024  No chief complaint on file.   HPI:   The following portions of the patient's history were reviewed and updated as appropriate: past medical history, past surgical history, family history, social history, allergies, medications, and problem list.   Patient Active Problem List   Diagnosis Date Noted   Left hand pain 03/25/2023   Arthritis of carpometacarpal joint 03/19/2023   Plantar fasciitis 12/25/2022   Entrapment of right ulnar nerve 12/25/2022   Ganglion cyst of dorsum of right wrist 12/25/2022   Pain of left hip 12/25/2022   Bone spur of other site 12/25/2022   Primary osteoarthritis of both knees 12/25/2022   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   Breast pain, left 12/25/2022   Tightness of left heel cord 09/05/2021   SI (sacroiliac) pain 11/01/2019   Spondylosis without myelopathy or radiculopathy, cervical region 08/02/2019   Ulnar neuropathy of left upper extremity 01/08/2019   Tendinitis of left rotator cuff 01/08/2019   Lumbar radiculopathy 12/11/2018   Patellofemoral stress syndrome of left knee 09/04/2018   Pure hypercholesterolemia 04/06/2018   Class 3 severe obesity due to excess calories with serious comorbidity and body mass index (BMI) of 40.0 to 44.9 in adult (HCC) 02/19/2018   Abnormal cervical Pap smear with positive HPV DNA test 05/10/2015   Reactive depression 04/19/2015   Chronic asthma without complication 04/18/2015   Anaphylactic reaction 04/12/2015   Past Medical History:  Diagnosis Date   Asthma with exacerbation 09/01/2014   Depression    Headache    History of laparoscopic adjustable gastric banding-done in [place] 01/26/2014   No info on type of band   Hypoxia    S/P gastric surgery  09/01/2014   Status post laparoscopic cholecystectomy August 2015 01/28/2014   Past Surgical History:  Procedure Laterality Date   ABDOMINAL HYSTERECTOMY     CHOLECYSTECTOMY N/A 01/28/2014   Procedure: LAPAROSCOPIC CHOLECYSTECTOMY WITH INTRAOPERATIVE CHOLANGIOGRAM;  Surgeon: Donnice KATHEE Lunger, MD;  Location: [facility];  Service: General;  Laterality: N/A;   KNEE SURGERY     LAPAROSCOPIC GASTRIC BANDING N/A 2013   performed in [place]   TONSILLECTOMY     Family History  Adopted: Yes  Family history unknown: Yes    Current Outpatient Medications:    amphetamine -dextroamphetamine  (ADDERALL) 20 MG tablet, Take 1 tablet (20 mg total) by mouth 2 (two) times daily., Disp: 60 tablet, Rfl: 0   amphetamine -dextroamphetamine  (ADDERALL) 20 MG tablet, Take 1 tablet (20 mg total) by mouth 2 (two) times daily., Disp: 60 tablet, Rfl: 0   amphetamine -dextroamphetamine  (ADDERALL) 20 MG tablet, Take 1 tablet (20 mg total) by mouth 2 (two) times daily., Disp: 60 tablet, Rfl: 0   BIOTIN PO, Take by mouth daily., Disp: , Rfl:    EPINEPHrine  0.3 mg/0.3 mL IJ SOAJ injection, Inject 0.3 mLs (0.3 mg total) into the muscle once., Disp: 1 Device, Rfl: 1   pravastatin  (PRAVACHOL ) 10 MG tablet, Take 1 tablet (10 mg total) by mouth daily., Disp: 30 tablet, Rfl: 0   traMADol  (ULTRAM ) 50 MG tablet, Take 1 tablet (50 mg total) by mouth every 12 (twelve) hours as needed., Disp: 5 tablet, Rfl: 0   Turmeric (QC TUMERIC COMPLEX PO), Take by mouth., Disp: , Rfl:  Allergies  Allergen Reactions   Nystatin Anaphylaxis and Swelling  Patient broke out in rash on extremities, had swelling of the nasal passage, throat and lips. Reaction was increasing over the course of 1-3 days.   Mango Flavoring Agent (Non-Screening) Swelling   Other Hives   Sulfa Antibiotics Hives   Poison Ivy Extract Rash     ROS: A complete ROS was performed with pertinent positives/negatives noted in the HPI. The remainder of the ROS are negative.     Objective:   There were no vitals filed for this visit.  GENERAL: Well-appearing, in NAD. Well nourished.  SKIN: Pink, warm and dry. No rash.  HEENT:    HEAD: Normocephalic, non-traumatic.  EYES: Conjunctive pink without exudate. PERRL.  EARS: External ear w/o redness, swelling, masses, or lesions. EAC clear. TM's intact, translucent w/o bulging, appropriate landmarks visualized.  NOSE: Septum midline w/o deformity. Nares patent, mucosa pink and non-inflamed w/o drainage. No sinus tenderness.  THROAT: Uvula midline. Oropharynx clear. Tonsils non-inflamed w/o exudate ***. Mucus membranes pink and moist.  NECK: Trachea midline. Full ROM w/o pain or tenderness. No lymphadenopathy.  RESPIRATORY: Chest wall symmetrical. Respirations even and non-labored. Breath sounds clear to auscultation bilaterally.  CARDIAC: S1, S2 present, regular rate and rhythm. Peripheral pulses 2+ bilaterally.  EXTREMITIES: Without clubbing, cyanosis, or edema.  NEUROLOGIC: Steady, even gait.  PSYCH/MENTAL STATUS: Alert, oriented x 3. Cooperative, appropriate mood and affect.    No results found for any visits on 04/26/24.    Assessment & Plan:    There are no diagnoses linked to this encounter. No orders of the defined types were placed in this encounter.  No orders of the defined types were placed in this encounter.  Lab Orders  No laboratory test(s) ordered today   No images are attached to the encounter or orders placed in the encounter.  No follow-ups on file.   Rosina Senters, FNP

## 2024-04-27 ENCOUNTER — Encounter: Payer: Self-pay | Admitting: Internal Medicine

## 2024-04-27 ENCOUNTER — Ambulatory Visit: Payer: PRIVATE HEALTH INSURANCE | Admitting: Internal Medicine

## 2024-04-27 VITALS — BP 132/72 | HR 70 | Temp 98.7°F | Ht 71.0 in | Wt 252.4 lb

## 2024-04-27 DIAGNOSIS — H6502 Acute serous otitis media, left ear: Secondary | ICD-10-CM | POA: Diagnosis not present

## 2024-04-27 DIAGNOSIS — R300 Dysuria: Secondary | ICD-10-CM | POA: Diagnosis not present

## 2024-04-27 DIAGNOSIS — J069 Acute upper respiratory infection, unspecified: Secondary | ICD-10-CM | POA: Diagnosis not present

## 2024-04-27 LAB — POC URINALSYSI DIPSTICK (AUTOMATED)
Bilirubin, UA: NEGATIVE
Glucose, UA: NEGATIVE
Ketones, UA: NEGATIVE
Leukocytes, UA: NEGATIVE
Nitrite, UA: NEGATIVE
Protein, UA: POSITIVE — AB
Spec Grav, UA: 1.025 (ref 1.010–1.025)
Urobilinogen, UA: 0.2 U/dL
pH, UA: 6 (ref 5.0–8.0)

## 2024-04-27 LAB — POCT INFLUENZA A/B
Influenza A, POC: NEGATIVE
Influenza B, POC: NEGATIVE

## 2024-04-27 LAB — POC COVID19 BINAXNOW: SARS Coronavirus 2 Ag: NEGATIVE

## 2024-04-27 MED ORDER — CEFDINIR 300 MG PO CAPS: 300.0000 mg | ORAL_CAPSULE | Freq: Two times a day (BID) | ORAL | 0 refills | Status: AC

## 2024-04-27 NOTE — Progress Notes (Signed)
 [facility] [address] [place] [address] Dept: [phone] Dept Fax: [phone]  Acute Care Office Visit  Subjective:   Melissa Dodson 03/20/1971 04/27/2024  Chief Complaint  Patient presents with   Sore Throat    Symptoms since Thursday headache, body aches, fever, diarrhea ear pain both  burning while urinating started yesterday lower back pain    HPI:  Discussed the use of AI scribe software for clinical note transcription with the patient, who gave verbal consent to proceed.  History of Present Illness   Melissa Dodson is a 53 year old female who presents with sore throat, fever, and urinary symptoms.  Symptoms began on Thursday night with a sore throat and headache. By Friday, she developed a fever slightly below 101F. Over the weekend, she experienced diarrhea, which resolved by today, and some nausea without vomiting. She describes bilateral ear pain, initially starting on the left side and then involving both ears. She notes congestion with yellow sputum when coughing. No chest pain or difficulty breathing.  She reports burning urination that started yesterday or the day before, with a foul smell. No increased frequency of urination. She is unsure about the color of her urine due to a black toilet and has not noticed any blood in her urine. She also reports bilateral lower back pain that started today, describing it as a constant, non-fluctuating ache that does not radiate to the abdomen.  For symptom relief, she has been taking ibuprofen , which helps with the fever. Also has taken Dayquil.      The following portions of the patient's history were reviewed and updated as appropriate: past medical history, past surgical history, family history, social history, allergies, medications, and problem list.   Patient Active Problem List   Diagnosis Date Noted   Left hand pain 03/25/2023   Arthritis of  carpometacarpal joint 03/19/2023   Plantar fasciitis 12/25/2022   Entrapment of right ulnar nerve 12/25/2022   Ganglion cyst of dorsum of right wrist 12/25/2022   Pain of left hip 12/25/2022   Bone spur of other site 12/25/2022   Primary osteoarthritis of both knees 12/25/2022   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   Breast pain, left 12/25/2022   Tightness of left heel cord 09/05/2021   SI (sacroiliac) pain 11/01/2019   Spondylosis without myelopathy or radiculopathy, cervical region 08/02/2019   Ulnar neuropathy of left upper extremity 01/08/2019   Tendinitis of left rotator cuff 01/08/2019   Lumbar radiculopathy 12/11/2018   Patellofemoral stress syndrome of left knee 09/04/2018   Pure hypercholesterolemia 04/06/2018   Class 3 severe obesity due to excess calories with serious comorbidity and body mass index (BMI) of 40.0 to 44.9 in adult (HCC) 02/19/2018   Abnormal cervical Pap smear with positive HPV DNA test 05/10/2015   Reactive depression 04/19/2015   Chronic asthma without complication 04/18/2015   Anaphylactic reaction 04/12/2015   Past Medical History:  Diagnosis Date   Asthma with exacerbation 09/01/2014   Depression    Headache    History of laparoscopic adjustable gastric banding-done in [place] 01/26/2014   No info on type of band   Hypoxia    S/P gastric surgery 09/01/2014   Status post laparoscopic cholecystectomy August 2015 01/28/2014   Past Surgical History:  Procedure Laterality Date   ABDOMINAL HYSTERECTOMY     CHOLECYSTECTOMY N/A 01/28/2014   Procedure: LAPAROSCOPIC CHOLECYSTECTOMY WITH INTRAOPERATIVE CHOLANGIOGRAM;  Surgeon: Donnice KATHEE Lunger, MD;  Location: [facility];  Service: General;  Laterality: N/A;   KNEE SURGERY     LAPAROSCOPIC GASTRIC BANDING N/A 2013   performed in [place]   TONSILLECTOMY     Family History  Adopted: Yes  Family history unknown: Yes    Current Outpatient Medications:     amphetamine -dextroamphetamine  (ADDERALL) 20 MG tablet, Take 1 tablet (20 mg total) by mouth 2 (two) times daily., Disp: 60 tablet, Rfl: 0   amphetamine -dextroamphetamine  (ADDERALL) 20 MG tablet, Take 1 tablet (20 mg total) by mouth 2 (two) times daily., Disp: 60 tablet, Rfl: 0   amphetamine -dextroamphetamine  (ADDERALL) 20 MG tablet, Take 1 tablet (20 mg total) by mouth 2 (two) times daily., Disp: 60 tablet, Rfl: 0   BIOTIN PO, Take by mouth daily., Disp: , Rfl:    cefdinir (OMNICEF) 300 MG capsule, Take 1 capsule (300 mg total) by mouth 2 (two) times daily for 7 days., Disp: 14 capsule, Rfl: 0   EPINEPHrine  0.3 mg/0.3 mL IJ SOAJ injection, Inject 0.3 mLs (0.3 mg total) into the muscle once., Disp: 1 Device, Rfl: 1   Turmeric (QC TUMERIC COMPLEX PO), Take by mouth., Disp: , Rfl:    pravastatin  (PRAVACHOL ) 10 MG tablet, Take 1 tablet (10 mg total) by mouth daily. (Patient not taking: Reported on 04/27/2024), Disp: 30 tablet, Rfl: 0   traMADol  (ULTRAM ) 50 MG tablet, Take 1 tablet (50 mg total) by mouth every 12 (twelve) hours as needed. (Patient not taking: Reported on 04/27/2024), Disp: 5 tablet, Rfl: 0 Allergies  Allergen Reactions   Nystatin Anaphylaxis and Swelling    Patient broke out in rash on extremities, had swelling of the nasal passage, throat and lips. Reaction was increasing over the course of 1-3 days.   Mango Flavoring Agent (Non-Screening) Swelling   Other Hives   Sulfa Antibiotics Hives   Poison Ivy Extract Rash     ROS: A complete ROS was performed with pertinent positives/negatives noted in the HPI. The remainder of the ROS are negative.    Objective:   Today's Vitals   04/27/24 0957  BP: 132/72  Pulse: 70  Temp: 98.7 F (37.1 C)  TempSrc: Temporal  SpO2: 97%  Weight: 252 lb 6.4 oz (114.5 kg)  Height: 5' 11 (1.803 m)    GENERAL: ill-appearing, nontoxic in NAD. Well nourished.  SKIN: Pink, warm and dry. No rash.  HEENT:    HEAD: Normocephalic, non-traumatic.   EYES: Conjunctive pink without exudate. PERRL.  EARS: External ear w/o redness, swelling, masses, or lesions. EAC clear. Left TM intact hazy, bulging with erythema. Right TM intact, translucent w/o bulging, appropriate landmarks visualized.  NOSE: Septum midline w/o deformity. Nares patent, mucosa pink and inflamed with drainage. Frontal sinus tenderness.  THROAT: Uvula midline. Oropharynx clear. Tonsils absent. Mucus membranes pink and moist.  NECK: Trachea midline. Full ROM w/o pain or tenderness. No lymphadenopathy.  RESPIRATORY: Chest wall symmetrical. Respirations even and non-labored. Breath sounds clear to auscultation bilaterally.  CARDIAC: S1, S2 present, regular rate and rhythm. Peripheral pulses 2+ bilaterally.  GI: Abdomen soft, non-tender. Normoactive bowel sounds. No rebound tenderness. No hepatomegaly or splenomegaly. No CVA tenderness.  MSK: diffuse lower back pain, not CVA tender.  EXTREMITIES: Without clubbing, cyanosis, or edema.  NEUROLOGIC: Steady, even gait.  PSYCH/MENTAL STATUS: Alert, oriented x 3. Cooperative, appropriate mood and affect.    Results for orders placed or performed in visit on 04/27/24  POCT Urinalysis Dipstick (Automated)  Result Value Ref Range   Color, UA     Clarity, UA  Glucose, UA Negative Negative   Bilirubin, UA negative    Ketones, UA negative    Spec Grav, UA 1.025 1.010 - 1.025   Blood, UA +    pH, UA 6.0 5.0 - 8.0   Protein, UA Positive (A) Negative   Urobilinogen, UA 0.2 0.2 or 1.0 E.U./dL   Nitrite, UA negative    Leukocytes, UA Negative Negative  POC COVID-19  Result Value Ref Range   SARS Coronavirus 2 Ag Negative Negative  POCT Influenza A/B  Result Value Ref Range   Influenza A, POC Negative Negative   Influenza B, POC Negative Negative      Assessment & Plan:  Assessment and Plan    Acute upper respiratory infection with otitis media of left ear - Cefdinir 300mg  BID x 7 days - can continue dayquil and ibuprofen     Burning with urination - Sent urine sample for culture to confirm possible UTI. - Cefidinir 300mg  BID x 7 days   Meds ordered this encounter  Medications   cefdinir (OMNICEF) 300 MG capsule    Sig: Take 1 capsule (300 mg total) by mouth 2 (two) times daily for 7 days.    Dispense:  14 capsule    Refill:  0    Supervising Provider:   SEBASTIAN BEVERLEY NOVAK [[id number]]   Orders Placed This Encounter  Procedures   Urine Culture   POCT Urinalysis Dipstick (Automated)   POC COVID-19   POCT Influenza A/B   Lab Orders         Urine Culture         POCT Urinalysis Dipstick (Automated)         POC COVID-19         POCT Influenza A/B     No images are attached to the encounter or orders placed in the encounter.  Return if symptoms worsen or fail to improve.   Rosina Senters, FNP

## 2024-04-29 LAB — URINE CULTURE
MICRO NUMBER:: 17219869
SPECIMEN QUALITY:: ADEQUATE

## 2024-04-30 ENCOUNTER — Ambulatory Visit: Payer: Self-pay | Admitting: Internal Medicine

## 2024-05-25 ENCOUNTER — Encounter: Payer: Self-pay | Admitting: Internal Medicine

## 2024-05-25 ENCOUNTER — Other Ambulatory Visit: Payer: Self-pay | Admitting: Internal Medicine

## 2024-05-25 DIAGNOSIS — F988 Other specified behavioral and emotional disorders with onset usually occurring in childhood and adolescence: Secondary | ICD-10-CM

## 2024-05-26 NOTE — Telephone Encounter (Signed)
 Spoke with pt she would like an appointment to see PCP schedule for 01/23 @ 9:20

## 2024-06-26 ENCOUNTER — Other Ambulatory Visit: Payer: Self-pay | Admitting: Internal Medicine

## 2024-06-26 DIAGNOSIS — F988 Other specified behavioral and emotional disorders with onset usually occurring in childhood and adolescence: Secondary | ICD-10-CM

## 2024-06-28 NOTE — Telephone Encounter (Signed)
 Patient called in to check on the status of this medication being sent to pharmacy for her. She stated that she is completely out and will really need it for tomorrow.

## 2024-06-28 NOTE — Telephone Encounter (Signed)
 Requesting: AMPHETAMINE  SALTS 20MG  TAB(GEN ADDERALL)  Last Visit: 04/27/2024 Next Visit: 07/09/2024 Last Refill: 03/25/2024  Please Advise

## 2024-06-29 NOTE — Telephone Encounter (Signed)
Script sent. Pt notified.

## 2024-06-29 NOTE — Telephone Encounter (Unsigned)
 Copied from CRM [id number]. Topic: Clinical - Medication Question >> Jun 28, 2024 11:31 AM Alfonso HERO wrote: Reason for CRM: patient calling to check on the status of her refill request. >> Jun 28, 2024  5:03 PM Alfonso ORN wrote: Pt called to check status of refill request. Advised not yet completed by provider

## 2024-06-29 NOTE — Telephone Encounter (Addendum)
 I called and spoke with patient and notified her that medication refill request was approved and sent to pharmacy.

## 2024-07-09 ENCOUNTER — Ambulatory Visit: Payer: PRIVATE HEALTH INSURANCE | Admitting: Internal Medicine
